# Patient Record
Sex: Male | Born: 1975 | Race: White | Hispanic: No | Marital: Married | Smoking: Former smoker
Health system: Southern US, Community
[De-identification: ages and names within clinical notes are randomized; demographics above are authoritative.]

## PROBLEM LIST (undated history)

## (undated) DIAGNOSIS — K219 Gastro-esophageal reflux disease without esophagitis: Secondary | ICD-10-CM

## (undated) DIAGNOSIS — K200 Eosinophilic esophagitis: Secondary | ICD-10-CM

## (undated) DIAGNOSIS — I2699 Other pulmonary embolism without acute cor pulmonale: Secondary | ICD-10-CM

## (undated) DIAGNOSIS — C649 Malignant neoplasm of unspecified kidney, except renal pelvis: Secondary | ICD-10-CM

## (undated) DIAGNOSIS — M1990 Unspecified osteoarthritis, unspecified site: Secondary | ICD-10-CM

## (undated) HISTORY — PX: HAND SURGERY: SHX662

## (undated) HISTORY — PX: SHOULDER SURGERY: SHX246

## (undated) HISTORY — DX: Gastro-esophageal reflux disease without esophagitis: K21.9

## (undated) HISTORY — DX: Eosinophilic esophagitis: K20.0

## (undated) HISTORY — PX: CHOLECYSTECTOMY: SHX55

## (undated) HISTORY — DX: Malignant neoplasm of unspecified kidney, except renal pelvis: C64.9

## (undated) HISTORY — PX: PARTIAL NEPHRECTOMY: SHX414

---
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
## 2003-03-17 ENCOUNTER — Emergency Department (HOSPITAL_COMMUNITY): Admission: EM | Admit: 2003-03-17 | Discharge: 2003-03-17 | Payer: Self-pay | Admitting: Internal Medicine

## 2007-12-03 ENCOUNTER — Emergency Department (HOSPITAL_COMMUNITY): Admission: EM | Admit: 2007-12-03 | Discharge: 2007-12-03 | Payer: Self-pay | Admitting: Emergency Medicine

## 2007-12-05 ENCOUNTER — Ambulatory Visit: Payer: Self-pay | Admitting: Urgent Care

## 2007-12-08 ENCOUNTER — Encounter (HOSPITAL_COMMUNITY): Admission: RE | Admit: 2007-12-08 | Discharge: 2008-01-07 | Payer: Self-pay | Admitting: Internal Medicine

## 2007-12-15 ENCOUNTER — Ambulatory Visit (HOSPITAL_COMMUNITY): Admission: RE | Admit: 2007-12-15 | Discharge: 2007-12-15 | Payer: Self-pay | Admitting: General Surgery

## 2007-12-15 ENCOUNTER — Encounter (INDEPENDENT_AMBULATORY_CARE_PROVIDER_SITE_OTHER): Payer: Self-pay | Admitting: General Surgery

## 2008-01-30 ENCOUNTER — Ambulatory Visit: Payer: Self-pay | Admitting: Internal Medicine

## 2010-09-05 NOTE — Assessment & Plan Note (Signed)
NAMEMarland [name]  VLADIMIR, LENHOFF              CHART#:  [id number]   DATE:  01/30/2008                       DOB:  November 27, 1975   PRIMARY CARE PHYSICIAN:  Mila Homer. Sudie Bailey, MD   CHIEF COMPLAINT:  Abdominal bloating and mid upper abdominal pain.   PROBLEM LIST:  1. Biliary dyskinesia and chronic cholecystitis, underwent      laparoscopic cholecystectomy by Dr. Franky Macho on December 11, 2007.  2. Abdominal bloating and increased belching, status post      cholecystectomy.  3. CT of the abdomen and pelvis with contrast on December 03, 2007,      spondylosis, otherwise normal, nothing to explain abdominal pain.   SUBJECTIVE:  The patient is a 35 year old obese Caucasian male, who is  status post laparoscopic cholecystectomy for chronic  cholecystitis/biliary dyskinesia by Dr. Lovell Sheehan on December 15, 2007.  He  did feel better after the surgery.  However, a couple of days postop,  when he began to the eat again, he began to have abdominal bloating.  His right lower quadrant pain and acute episodes of nausea and vomiting  have subsided at this point.  His main concern is an upper to mid  abdominal fullness and bloating along with some nausea and diaphoresis  at times.  The pain is anywhere from 5-8/10 on pain scale.  It is  usually after he eats, he is down to eating one meal a day.  He does  have a 16-pound documented weight loss since.  Initially seen just under  2 months ago.  He is only eating one meal a day, usually around 1:30  p.m.  He tells me within an hour to an hour and half after eating, he  has significant abdominal bloating.  He has had some bouts of diarrhea  usually only twice per week.  He denies any rectal bleeding or melena.  When he has bouts, he may have two episodes of diarrhea.  He denies any  heartburn or indigestion.  He was previously on Protonix, but only to  took it for about 10 days.  He has noticed increased belching.  Denies  any problems with increased  flatus.   CURRENT MEDICATIONS:  Tylenol p.r.n.   ALLERGIES:  Penicillin and amoxicillin.   OBJECTIVE:  VITAL SIGNS:  Weight 236 pounds, height 73 inches,  temperature 98 degrees, blood pressure 118/78, pulse 76.  GENERAL:  The patient is an obese Caucasian male, who is alert,  oriented, pleasant, and cooperative, in no acute distress.  HEENT:  Sclerae clear, nonicteric.  Conjunctivae pink.  Oropharynx pink  and moist without any lesions.  CHEST:  Heart, regular rate and rhythm.  Normal S1 and S2 without  murmurs, clicks, rubs, or gallops.  ABDOMEN:  Protuberant with positive bowel sounds x4.  No bruits  auscultated.  Soft, nontender, nondistended without palpable mass or  hepatosplenomegaly.  No rebound, tenderness, or guarding.  EXTREMITIES:  Without clubbing or edema.   ASSESSMENT:  The patient is a 35 year old Caucasian male, who is status  post laparoscopic cholecystectomy for chronic cholecystitis/biliary  dyskinesia.  He has had a 16-pound weight loss since surgery.  He  continues to have postprandial abdominal bloating in the mid to upper  abdominal pain.  He has had a good amount of belching as well.  I  suspect his dyspepsia could be related to gastroesophageal reflux  disease and I would like for him to have a good trial of proton pump  inhibitor for at least 2 weeks.  I suspect he could also have some  irritable bowel syndrome as well, and we should rule out celiac disease.  Small bowel bacterial overgrowth remains in the differential as well.   PLAN:  1. He is to begin probiotics.  I have given him the names of [facility].  2. Begin omeprazole 20 mg daily for 2 weeks trial.  I have given a      prescription for #31 with 2 refills.  3. He is going to call me in 2 weeks with a progress report.  4. Levsin 0.125 mg a.c. and at bedtime p.r.n. abdominal pain and      diarrhea, #60 with one refill.  5. CBC, LFTs, celiac antibody panel, and  IgA.  6. If he is no better in 2 weeks, we will consider EGD with possible      small bowel biopsies as well as colonoscopy if he continues to have      diarrhea.       Lorenza Burton, N.P.  Electronically Signed     R. Roetta Sessions, M.D.  Electronically Signed    KJ/MEDQ  D:  01/30/2008  T:  01/31/2008  Job:  [id number]   cc:   Mila Homer. Sudie Bailey, M.D.

## 2010-09-05 NOTE — Op Note (Signed)
NAMEZENO, HICKEL             ACCOUNT NO.:  [id number]   MEDICAL RECORD NO.:  [id number]          PATIENT TYPE:  AMB   LOCATION:  DAY                           FACILITY:  [facility]   PHYSICIAN:  Dalia Heading, M.D.  DATE OF BIRTH:  Dec 10, 1975   DATE OF PROCEDURE:  12/15/2007  DATE OF DISCHARGE:                               OPERATIVE REPORT   PREOPERATIVE DIAGNOSIS:  Chronic cholecystitis.   POSTOPERATIVE DIAGNOSIS:  Chronic cholecystitis.   PROCEDURE:  Laparoscopic cholecystectomy.   SURGEON:  Dalia Heading, MD   ANESTHESIA:  General endotracheal.   INDICATIONS:  The patient is a 35 year old white male who presents with  biliary colic secondary to chronic cholecystitis.  The risks and  benefits of the procedure including bleeding, infection, hepatobiliary  injury, and the possibility of an open procedure were fully explained to  the patient and gave informed consent.   PROCEDURE NOTE:  The patient was placed in a supine position.  After  induction of general endotracheal anesthesia, the abdomen was prepped  and draped using the usual sterile technique with Betadine.  Surgical  site confirmation was performed.   An infraumbilical incision was made down to the fascia.  A Veress needle  was introduced into the abdominal cavity and confirmation of placement  was done using the saline drop test.  The abdomen was then insufflated  to 16 mmHg pressure.  An 11-mm trocar was introduced into the abdominal  cavity under direct visualization without difficulty.  The patient was  placed in reversed Trendelenburg position.  An additional 11-mm trocar  was placed in the epigastric region and 5-mm trocars were placed in the  right upper quadrant and right flank regions.  The liver was inspected  and noted to be within normal limits.  The gallbladder was retracted  superior and laterally.  The dissection was begun around the  infundibulum of the gallbladder.  The cystic duct was first  identified.  The juncture to the infundibulum was fully identified.  Endo clips were  placed proximally and distally on the cystic duct and cystic duct was  divided.  This likewise done on the cystic artery.  The gallbladder was  then freed away from the gallbladder fossa using Bovie electrocautery.  The gallbladder was delivered through the epigastric trocar site using  EndoCatch bag.  The gallbladder fossa was inspected and no abnormal  bleeding or bile leakage was noted.  Surgicel was placed in the  gallbladder fossa.  All fluid and air were then evacuated from the  abdominal cavity prior to removal of the trocars.   All wounds were irrigated with normal saline.  All wounds were injected  with 0.5% Sensorcaine.  The infraumbilical fascia was reapproximated  using an 0 Vicryl interrupted suture.  All skin incisions were closed  using staples.  Betadine ointment and dry sterile dressings were  applied.   All tape and needle counts were correct at the end of the procedure.  The patient was extubated in the operating room and went back to the  recovery room awake in stable condition.   COMPLICATIONS:  None.   SPECIMEN:  Gallbladder.   ESTIMATED BLOOD LOSS:  Minimal.      Dalia Heading, M.D.  Electronically Signed     MAJ/MEDQ  D:  12/15/2007  T:  12/15/2007  Job:  [id number]   cc:   R. Roetta Sessions, M.D.  [address]  [place] [address]

## 2010-09-05 NOTE — H&P (Signed)
NAMEPORFIRIO, Bobby [name]             ACCOUNT NO.:  [id number]   MEDICAL RECORD NO.:  [id number]          PATIENT TYPE:  AMB   LOCATION:  DAY                           FACILITY:  [facility]   PHYSICIAN:  Dalia Heading, M.D.  DATE OF BIRTH:  [birth year]   DATE OF ADMISSION:  DATE OF DISCHARGE:  LH                              HISTORY & PHYSICAL   CHIEF COMPLAINT:  Chronic cholecystitis.   HISTORY OF PRESENT ILLNESS:  The patient is a 35 year old white male who  is referred for evaluation and treatment of biliary colic secondary to  chronic cholecystitis.  He has been having intermittent right upper  quadrant abdominal pain with radiation to the flank, nausea, and  indigestion in the past few months.  It is made worse with fatty foods.  No fever, chills, or jaundice had been noted.   PAST MEDICAL HISTORY:  Unremarkable.   PAST SURGICAL HISTORY:  Unremarkable.   CURRENT MEDICATIONS:  Oxycodone and Protonix.   ALLERGIES:  PENICILLIN.   REVIEW OF SYSTEMS:  He smokes less than a pack of cigarettes a day.  He  denies any alcohol use.  He denies any other cardiopulmonary  difficulties or bleeding disorders.   PHYSICAL EXAMINATION:  GENERAL:  The patient is a well-developed, well-  nourished white male in no acute distress.  HEENT:  No scleral icterus.  LUNGS:  Clear to auscultation with equal breath sounds bilaterally.  HEART:  Regular rate and rhythm without S3, S4, or murmurs.  ABDOMEN:  Soft, nontender, and nondistended.  No hepatosplenomegaly,  masses, or hernias identified.  Ultrasound of the gallbladder was  negative.   The HIDA scan reveals chronic cholecystitis with a low gallbladder  ejection fraction and reproducible symptoms with a fatty meal.   IMPRESSION:  Chronic cholecystitis.   PLAN:  The patient was scheduled for laparoscopic cholecystectomy on  December 15, 2007.  The risks and benefits of the procedure including  bleeding, infection, hepatobiliary injury, and the  possibility of an  open procedure were fully explained to the patient, who gave informed  consent.  Percocet has been prescribed.      Dalia Heading, M.D.  Electronically Signed     MAJ/MEDQ  D:  12/11/2007  T:  12/12/2007  Job:  [id number]   cc:   Mila Homer. Sudie Bailey, M.D.  Fax: [phone]   R. Roetta Sessions, M.D.  [address]  [place]  [place] [address]

## 2010-09-05 NOTE — Assessment & Plan Note (Signed)
NAMEMarland Kitchen  EMIN, FOREE              CHART#:  [id number]   DATE:  12/05/2007                       DOB:  March 27, 1976   REQUESTING PHYSICIAN:  Mila Homer. Sudie Bailey, MD.   CHIEF COMPLAINT:  Abdominal pain.   HISTORY OF PRESENT ILLNESS:  The patient is a 35 year old Caucasian male  who is otherwise healthy.  About 2 months ago, he began to notice a  severe postprandial abdominal pain.  He says within 20 minutes of  eating, he describes of significant gas-type pain.  It is present mostly  in his right lower quadrant.  He tells me the pain is only present after  he eats.  If he does not eat, he is not bothered by the pain.  He had a  full set of stool studies, which is negative to Dr. Michelle Nasuti office  per his report.  He tells me he is scared to eat.  He has had some  intermittent diarrhea, but no more than 4 loose stools per day.  He can  go a day or so without a bowel movement.  His stools are occasionally  hard.  He complains of diaphoresis with the episodes.  Pain does radiate  to his right lower quadrant, right flank, and right back.  He rates pain  10/10 at worst on the pain scale.  His weight has remained stable.  He  does have rare heartburn or indigestion, but usually only with certain  foods and sodas.  His symptoms are worse at night.  He has been on  Protonix for a couple of days and does not notice a difference.  He does  have oxycodone for pain at home.  He denies any dysphagia, odynophagia,  early satiety, rectal bleeding, or melena.  He was Hemoccult negative in  the emergency room.  Thus far, his workup has included a CT scan of  abdomen and pelvis with contrast, which shows spondylosis.  He had a  bedside gallbladder ultrasound in the ER, which was negative as well.   PAST MEDICAL HISTORY:  Denies.   PAST SURGICAL HISTORY:  Denies.   CURRENT MEDICATIONS:  Pantoprazole 40 mg daily, oxycodone 7.5/500  p.r.n., Tums p.r.n., and Tylenol p.r.n.   ALLERGIES:  Penicillin and  amoxicillin.   FAMILY HISTORY:  There is no family history of colorectal carcinoma,  liver, or chronic GI problem.  Father does have a history of prostate  disorder and mother has migraine headaches.  He has 3 healthy siblings.   SOCIAL HISTORY:  The patient lives with his brother.  He is single.  He  is an Airline pilot for [employer].  He has a remote history of  tobacco use.  Denies alcohol or drug use.   REVIEW OF SYSTEMS:  See HPI.  GU:  He denies any dysuria, hematuria, and  increased urinary frequency or otherwise negative review of systems.   PHYSICAL EXAMINATION:  VITAL SIGNS:  Weight 252 pounds, height 73  inches, temperature 98.6, blood pressure 112/82, and pulse 68.  GENERAL:  He is a well-developed and well-nourished Caucasian male, in  no acute distress.  HEENT:  Sclerae clear and nonicteric.  Conjunctivae pink.  Oropharynx  pink and moist without any lesions.  NECK:  Supple without mass or thyromegaly.  CHEST:  Regular rate and rhythm.  Normal S1 and S2.  No  murmurs, clicks,  rubs, or gallops.  LUNGS:  Clear to auscultation bilaterally.  ABDOMEN:  Positive bowel sounds x4.  No bruits auscultated.  Soft,  nontender, and nondistended without palpable mass or hepatosplenomegaly.  No rashes or guarding.  Negative Murphy's sign.  No abdominal wall  pain/negative Carnett sign.  No rebound, tenderness, or guarding.  EXTREMITIES:  Without clubbing or edema bilaterally.   IMPRESSION:  The patient is a 35 year old Caucasian male with a 69-month  history of severe postprandial pain.  Interestingly, the pain is mostly  in his right lower quadrant, does occasionally radiate to his back.  His  symptoms are typical for biliary dyskinesia except for the fact of the  location of his pain, which is much lower than with typical biliary  pain.  He has been examined by Dr. Jena Gauss as well.  He was found to have  spondylosis on CT referred or radicular back pain would be less likely   given the fact that his pain seems to be more visceral.   PLAN:  1. HIDA scan.  2. He has oxycodone at home for pain.  3. He is to continue on pantoprazole 40 mg daily.  4. We will call him with HIDA results and go from there.  5. He is to go immediately to emergency room if his pain is worse or      not controlled by his medications or has any worsening signs or      symptoms and he agrees with this plan.   Thank you Dr. Sudie Bailey for allowing [place] to participate in care of the  patient.       Lorenza Burton, N.P.  Electronically Signed     R. Roetta Sessions, M.D.  Electronically Signed    KJ/MEDQ  D:  12/05/2007  T:  12/05/2007  Job:  [id number]   cc:   Mila Homer. Sudie Bailey, M.D.

## 2011-01-19 LAB — COMPREHENSIVE METABOLIC PANEL
ALT: 38
AST: 29
Albumin: 3.7
Alkaline Phosphatase: 59
BUN: 18
CO2: 30
Calcium: 8.9
Chloride: 103
Creatinine, Ser: 1.42
GFR calc Af Amer: >60
GFR calc non Af Amer: 59 — ABNORMAL LOW
Glucose, Bld: 91
Potassium: 3.7
Sodium: 138
Total Bilirubin: 0.8
Total Protein: 5.8 — ABNORMAL LOW

## 2011-01-19 LAB — DIFFERENTIAL
Basophils Absolute: 0.1
Basophils Relative: 1
Eosinophils Absolute: 0.4
Eosinophils Relative: 5
Lymphocytes Relative: 17
Lymphs Abs: 1.5
Monocytes Absolute: 0.3
Monocytes Relative: 4
Neutro Abs: 6.3
Neutrophils Relative %: 72

## 2011-01-19 LAB — URINALYSIS, ROUTINE W REFLEX MICROSCOPIC
Bilirubin Urine: NEGATIVE
Glucose, UA: NEGATIVE
Hgb urine dipstick: NEGATIVE
Ketones, ur: 15 — AB
Nitrite: NEGATIVE
Protein, ur: NEGATIVE
Specific Gravity, Urine: 1.035 — ABNORMAL HIGH
Urobilinogen, UA: 1
pH: 6.5

## 2011-01-19 LAB — CBC
HCT: 42
Hemoglobin: 14.6
MCHC: 34.7
MCV: 91.7
Platelets: 187
RBC: 4.57
RDW: 12.2
WBC: 8.6

## 2011-01-19 LAB — LIPASE, BLOOD: Lipase: 23

## 2011-01-19 LAB — OCCULT BLOOD X 1 CARD TO LAB, STOOL: Fecal Occult Bld: NEGATIVE

## 2012-11-12 ENCOUNTER — Encounter: Payer: Self-pay | Admitting: Gastroenterology

## 2012-11-12 ENCOUNTER — Ambulatory Visit (INDEPENDENT_AMBULATORY_CARE_PROVIDER_SITE_OTHER): Payer: BC Managed Care – PPO | Admitting: Gastroenterology

## 2012-11-12 VITALS — BP 130/80 | HR 95 | Temp 97.4°F | Ht 73.0 in | Wt 250.4 lb

## 2012-11-12 DIAGNOSIS — R1310 Dysphagia, unspecified: Secondary | ICD-10-CM

## 2012-11-12 DIAGNOSIS — K219 Gastro-esophageal reflux disease without esophagitis: Secondary | ICD-10-CM

## 2012-11-12 MED ORDER — ESOMEPRAZOLE MAGNESIUM 40 MG PO CPDR: 40.0000 mg | DELAYED_RELEASE_CAPSULE | Freq: Two times a day (BID) | ORAL | Status: DC

## 2012-11-12 NOTE — Progress Notes (Signed)
CC PCP 

## 2012-11-12 NOTE — Patient Instructions (Addendum)
Start taking Nexium twice a day, 30 minutes before breakfast and dinner. I have sent this to the pharmacy as well and provided samples to get your started.  Follow the soft diet for now. Chew very well. Avoid any tough textures.  We have set you up for an upper endoscopy with dilation with Dr. Jena Gauss in the near future. Further recommendations to follow.

## 2012-11-12 NOTE — Progress Notes (Signed)
Primary Care Physician:  Milana Obey, MD Primary Gastroenterologist:  Dr. Jena Gauss   Chief Complaint  Patient presents with  . Dysphagia    HPI:   Bobby [name] is a pleasant 37 year old male presenting today as a self-referral secondary to dysphagia and dyspepsia. He was last seen by [place] several years ago but did not undergo any procedures.   Worsening dysphagia, daily heartburn. Feels like a ball of fire epigastric region. Symptoms a little over a year. Now regurgitating food. Intense epigastric pain at times. Breads, red meats cause issues. Sometimes can't swallow soda, water, tea. "keeps piling". No odynophagia. If food gets lodged, has to throw it up. No pill dysphagia. Decreased po intake due to fear of eating. No weight loss. No melena. Rare NSAIDs for headache, usually three times a month.   Has taken Prilosec in past. Now just Tums.   Past Medical History  Diagnosis Date  . GERD (gastroesophageal reflux disease)     Past Surgical History  Procedure Laterality Date  . Cholecystectomy    . Hand surgery      right    Current Outpatient Prescriptions  Medication Sig Dispense Refill  . calcium carbonate (TUMS EX) 750 MG chewable tablet Chew 1 tablet by mouth daily.      Marland Kitchen esomeprazole (NEXIUM) 40 MG capsule Take 1 capsule (40 mg total) by mouth 2 (two) times daily.  60 capsule  3   No current facility-administered medications for this visit.    Allergies as of 11/12/2012 - Review Complete 11/12/2012  Allergen Reaction Noted  . Penicillins Hives 11/12/2012    Family History  Problem Relation Age of Onset  . Colon cancer Neg Hx     History   Social History  . Marital Status: Single    Spouse Name: N/A    Number of Children: N/A  . Years of Education: N/A   Occupational History  . [employer]    Social History Main Topics  . Smoking status: Never Smoker   . Smokeless tobacco: Current User    Types: Chew  . Alcohol Use: Yes     Comment: rare  . Drug Use: No   . Sexually Active: Not on file   Other Topics Concern  . Not on file   Social History Narrative  . No narrative on file    Review of Systems: Negative unless mentioned in HPI.   Physical Exam: BP 130/80  Pulse 95  Temp(Src) 97.4 F (36.3 C) (Oral)  Ht 6\' 1"  (1.854 m)  Wt 250 lb 6.4 oz (113.581 kg)  BMI 33.04 kg/m2 General:   Alert and oriented. Pleasant and cooperative. Well-nourished and well-developed.  Head:  Normocephalic and atraumatic. Eyes:  Without icterus, sclera clear and conjunctiva pink.  Ears:  Normal auditory acuity. Nose:  No deformity, discharge,  or lesions. Mouth:  No deformity or lesions, oral mucosa pink.  Neck:  Supple, without mass or thyromegaly. Lungs:  Clear to auscultation bilaterally. No wheezes, rales, or rhonchi. No distress.  Heart:  S1, S2 present without murmurs appreciated.  Abdomen:  +BS, soft, non-tender and non-distended. No HSM noted. No guarding or rebound. No masses appreciated.  Rectal:  Deferred  Msk:  Symmetrical without gross deformities. Normal posture. Extremities:  Without clubbing or edema. Neurologic:  Alert and  oriented x4;  grossly normal neurologically. Skin:  Intact without significant lesions or rashes. Cervical Nodes:  No significant cervical adenopathy. Psych:  Alert and cooperative. Normal mood and affect.

## 2012-11-12 NOTE — Assessment & Plan Note (Signed)
Uncontrolled and symptomatic. Start Nexium BID.

## 2012-11-12 NOTE — Assessment & Plan Note (Signed)
37 year old male presenting with history of chronic GERD, now with worsening solid food dysphagia, regurgitation of food, and dyspepsia for at least a year. Epigastric discomfort noted after eating at times; gallbladder absent. He has severe GERD, taking multiple Tums per day. Very minimal NSAID use. No melena. At this time, query uncontrolled GERD as culprit, with likely stricture, ring, or web. Needs EGD/ED as soon as possible; I have discussed avoidance of tough textures and only following a soft diet until after procedure.   Proceed with upper endoscopy and dilation in the near future with Dr. Jena Gauss. The risks, benefits, and alternatives have been discussed in detail with patient. They have stated understanding and desire to proceed.  Nexium BID: samples provided. GERD diet and soft diet provided

## 2012-11-13 ENCOUNTER — Ambulatory Visit (HOSPITAL_COMMUNITY)
Admission: RE | Admit: 2012-11-13 | Discharge: 2012-11-13 | Disposition: A | Discharge status: Home / Self Care | Payer: BC Managed Care – PPO | Source: Ambulatory Visit | Attending: Internal Medicine | Admitting: Internal Medicine

## 2012-11-13 ENCOUNTER — Encounter (HOSPITAL_COMMUNITY): Payer: Self-pay | Admitting: *Deleted

## 2012-11-13 ENCOUNTER — Encounter (HOSPITAL_COMMUNITY)
Admission: RE | Disposition: A | Discharge status: Home / Self Care | Payer: Self-pay | Source: Ambulatory Visit | Attending: Internal Medicine

## 2012-11-13 DIAGNOSIS — K2940 Chronic atrophic gastritis without bleeding: Secondary | ICD-10-CM | POA: Insufficient documentation

## 2012-11-13 DIAGNOSIS — K2100 Gastro-esophageal reflux disease with esophagitis, without bleeding: Secondary | ICD-10-CM | POA: Insufficient documentation

## 2012-11-13 DIAGNOSIS — K219 Gastro-esophageal reflux disease without esophagitis: Secondary | ICD-10-CM

## 2012-11-13 DIAGNOSIS — R1310 Dysphagia, unspecified: Secondary | ICD-10-CM

## 2012-11-13 DIAGNOSIS — K2960 Other gastritis without bleeding: Secondary | ICD-10-CM

## 2012-11-13 HISTORY — PX: ESOPHAGOGASTRODUODENOSCOPY (EGD) WITH ESOPHAGEAL DILATION: SHX5812

## 2012-11-13 SURGERY — ESOPHAGOGASTRODUODENOSCOPY (EGD) WITH ESOPHAGEAL DILATION
Anesthesia: Moderate Sedation

## 2012-11-13 MED ORDER — MEPERIDINE HCL 100 MG/ML IJ SOLN
INTRAMUSCULAR | Status: AC
  Filled 2012-11-13: qty 1

## 2012-11-13 MED ORDER — MIDAZOLAM HCL 5 MG/5ML IJ SOLN
INTRAMUSCULAR | Status: DC | PRN
  Administered 2012-11-13: 1 mg via INTRAVENOUS
  Administered 2012-11-13 (×2): 2 mg via INTRAVENOUS

## 2012-11-13 MED ORDER — SODIUM CHLORIDE 0.9 % IV SOLN
INTRAVENOUS | Status: DC
  Administered 2012-11-13: 15:00:00 via INTRAVENOUS

## 2012-11-13 MED ORDER — ONDANSETRON HCL 4 MG/2ML IJ SOLN
INTRAMUSCULAR | Status: AC
  Filled 2012-11-13: qty 2

## 2012-11-13 MED ORDER — BUTAMBEN-TETRACAINE-BENZOCAINE 2-2-14 % EX AERO
INHALATION_SPRAY | CUTANEOUS | Status: DC | PRN
  Administered 2012-11-13: 2 via TOPICAL

## 2012-11-13 MED ORDER — STERILE WATER FOR IRRIGATION IR SOLN
Status: DC | PRN
  Administered 2012-11-13: 16:00:00

## 2012-11-13 MED ORDER — MIDAZOLAM HCL 5 MG/5ML IJ SOLN
INTRAMUSCULAR | Status: AC
  Filled 2012-11-13: qty 10

## 2012-11-13 MED ORDER — ONDANSETRON HCL 4 MG/2ML IJ SOLN
INTRAMUSCULAR | Status: DC | PRN
  Administered 2012-11-13: 4 mg via INTRAVENOUS

## 2012-11-13 MED ORDER — MEPERIDINE HCL 100 MG/ML IJ SOLN
INTRAMUSCULAR | Status: DC | PRN
  Administered 2012-11-13: 25 mg via INTRAVENOUS
  Administered 2012-11-13 (×2): 50 mg via INTRAVENOUS

## 2012-11-13 NOTE — Interval H&P Note (Signed)
History and Physical Interval Note:  11/13/2012 4:19 PM  Aris Lot  has presented today for surgery, with the diagnosis of GERD AND DYSPHAGIA  The various methods of treatment have been discussed with the patient and family. After consideration of risks, benefits and other options for treatment, the patient has consented to  Procedure(s) with comments: ESOPHAGOGASTRODUODENOSCOPY (EGD) WITH ESOPHAGEAL DILATION (N/A) - 3:00 as a surgical intervention .  The patient's history has been reviewed, patient examined, no change in status, stable for surgery.  I have reviewed the patient's chart and labs.  Questions were answered to the patient's satisfaction.     Kisa Fujii  No change. Hasn't been able start Nexium as of yet.The risks, benefits, limitations, alternatives and imponderables have been reviewed with the patient. Potential for esophageal dilation, biopsy, etc. have also been reviewed.  Questions have been answered. All parties agreeable.

## 2012-11-13 NOTE — Op Note (Signed)
[facility] [address] [place], [address]   ENDOSCOPY PROCEDURE REPORT  PATIENT: Bobby, [name]  MR#: [id number] BIRTHDATE: 18-Nov-1975 , 36  yrs. old GENDER: Male ENDOSCOPIST:  R.  Roetta Sessions, MD FACP FACG REFERRED BY:  Gareth Morgan, M.D. PROCEDURE DATE:  11/13/2012 PROCEDURE:     EGD with Elease Hashimoto dilation followed by esophageal and gastric biopsy  INDICATIONS:     Daily GERD and esophageal dysphagia  INFORMED CONSENT:   The risks, benefits, limitations, alternatives and imponderables have been discussed.  The potential for biopsy, esophogeal dilation, etc. have also been reviewed.  Questions have been answered.  All parties agreeable.  Please see the history and physical in the medical record for more information.  MEDICATIONS:    Versed 5 mg IV and Demerol 125 mg IV in divided doses. Zofran 4 mg IV. Cetacaine spray  DESCRIPTION OF PROCEDURE:   The EG-[id number] ([id number])  endoscope was introduced through the mouth and advanced to the second portion of the duodenum without difficulty or limitations.  The mucosal surfaces were surveyed very carefully during advancement of the scope and upon withdrawal.  Retroflexion view of the proximal stomach and esophagogastric junction was performed.      FINDINGS: Longitudinally somewhat furrowed  esophageal mucosa. Distal esophageal erosions within 5 mm the GE junction. No Barrett's esophagus seen. Patient's esophagus appeared widely patent and appropriate diameter given his relatively large frame. However, there was slight resistance upon advancing the diagnostic gastroscope through it. Stomach empty. Diffuse gastric erosions. No ulcer or infiltrating process observed. Patent pylorus. Duodenal erosions present; otherwise, the first  and second portion of the duodenum appeared normal.  THERAPEUTIC / DIAGNOSTIC MANEUVERS PERFORMED:  A 54 French Maloney dilator was  passed to full insertion with mild  resistance. A look back revealed superficial trauma involving the distal 5 cm of esophageal mucosa. There were no significant tears present. Minimal bleeding. Subsequently, biopsies of the gastric mucosa were taken and, finally, biopsies the mid and distal esophagus taken to evaluate for eosinophilic esophagitis.   COMPLICATIONS:  None  IMPRESSION:   Mild erosive reflux esophagitis -status post passage of a Maloney dilator. Status post esophageal biopsy Gastric and duodenal bulbar erosions-status post biopsy  RECOMMENDATIONS:  Begin Nexium 40 mg orally twice daily as previously recommended. Followup on pathology. Further recommendations to follow.    _______________________________ R. Roetta Sessions, MD FACP [facility] eSigned:  R. Roetta Sessions, MD FACP [facility] 11/13/2012 4:57 PM     CC:  PATIENT NAME:  Bobby, [name] MR#: [id number]

## 2012-11-13 NOTE — H&P (View-Only) (Signed)
Primary Care Physician:  KNOWLTON,STEPHEN D, MD Primary Gastroenterologist:  Dr. Rourk   Chief Complaint  Patient presents with  . Dysphagia    HPI:   [name] is a pleasant 36-year-old male presenting today as a self-referral secondary to dysphagia and dyspepsia. He was last seen by us several years ago but did not undergo any procedures.   Worsening dysphagia, daily heartburn. Feels like a ball of fire epigastric region. Symptoms a little over a year. Now regurgitating food. Intense epigastric pain at times. Breads, red meats cause issues. Sometimes can't swallow soda, water, tea. "keeps piling". No odynophagia. If food gets lodged, has to throw it up. No pill dysphagia. Decreased po intake due to fear of eating. No weight loss. No melena. Rare NSAIDs for headache, usually three times a month.   Has taken Prilosec in past. Now just Tums.   Past Medical History  Diagnosis Date  . GERD (gastroesophageal reflux disease)     Past Surgical History  Procedure Laterality Date  . Cholecystectomy    . Hand surgery      right    Current Outpatient Prescriptions  Medication Sig Dispense Refill  . calcium carbonate (TUMS EX) 750 MG chewable tablet Chew 1 tablet by mouth daily.      . esomeprazole (NEXIUM) 40 MG capsule Take 1 capsule (40 mg total) by mouth 2 (two) times daily.  60 capsule  3   No current facility-administered medications for this visit.    Allergies as of 11/12/2012 - Review Complete 11/12/2012  Allergen Reaction Noted  . Penicillins Hives 11/12/2012    Family History  Problem Relation Age of Onset  . Colon cancer Neg Hx     History   Social History  . Marital Status: Single    Spouse Name: N/A    Number of Children: N/A  . Years of Education: N/A   Occupational History  . [employer]    Social History Main Topics  . Smoking status: Never Smoker   . Smokeless tobacco: Current User    Types: Chew  . Alcohol Use: Yes     Comment: rare  . Drug Use: No   . Sexually Active: Not on file   Other Topics Concern  . Not on file   Social History Narrative  . No narrative on file    Review of Systems: Negative unless mentioned in HPI.   Physical Exam: BP 130/80  Pulse 95  Temp(Src) 97.4 F (36.3 C) (Oral)  Ht 6' 1" (1.854 m)  Wt 250 lb 6.4 oz (113.581 kg)  BMI 33.04 kg/m2 General:   Alert and oriented. Pleasant and cooperative. Well-nourished and well-developed.  Head:  Normocephalic and atraumatic. Eyes:  Without icterus, sclera clear and conjunctiva pink.  Ears:  Normal auditory acuity. Nose:  No deformity, discharge,  or lesions. Mouth:  No deformity or lesions, oral mucosa pink.  Neck:  Supple, without mass or thyromegaly. Lungs:  Clear to auscultation bilaterally. No wheezes, rales, or rhonchi. No distress.  Heart:  S1, S2 present without murmurs appreciated.  Abdomen:  +BS, soft, non-tender and non-distended. No HSM noted. No guarding or rebound. No masses appreciated.  Rectal:  Deferred  Msk:  Symmetrical without gross deformities. Normal posture. Extremities:  Without clubbing or edema. Neurologic:  Alert and  oriented x4;  grossly normal neurologically. Skin:  Intact without significant lesions or rashes. Cervical Nodes:  No significant cervical adenopathy. Psych:  Alert and cooperative. Normal mood and affect.    

## 2012-11-18 ENCOUNTER — Encounter: Payer: Self-pay | Admitting: Internal Medicine

## 2012-11-18 ENCOUNTER — Encounter (HOSPITAL_COMMUNITY): Payer: Self-pay | Admitting: Internal Medicine

## 2012-12-23 ENCOUNTER — Encounter: Payer: Self-pay | Admitting: Internal Medicine

## 2012-12-24 ENCOUNTER — Telehealth: Payer: Self-pay | Admitting: Internal Medicine

## 2012-12-24 ENCOUNTER — Ambulatory Visit: Payer: BC Managed Care – PPO | Admitting: Gastroenterology

## 2012-12-24 NOTE — Telephone Encounter (Signed)
Please send letter for f/u. First no-show.

## 2012-12-24 NOTE — Telephone Encounter (Signed)
Pt was a no show

## 2012-12-25 ENCOUNTER — Encounter: Payer: Self-pay | Admitting: Gastroenterology

## 2012-12-25 NOTE — Telephone Encounter (Signed)
Mailed letter °

## 2013-08-03 ENCOUNTER — Encounter (HOSPITAL_COMMUNITY): Payer: Self-pay | Admitting: Pharmacy Technician

## 2013-08-12 NOTE — Patient Instructions (Signed)
Bobby [name]  08/12/2013   Your procedure is scheduled on:   08/17/2013  [name]  [facility].  Call this number if you have problems the morning of surgery: [phone]   Remember:   Do not eat food or drink liquids after midnight.   Take these medicines the morning of surgery with A SIP OF WATER:  nexium   Do not wear jewelry, make-up or nail polish.  Do not wear lotions, powders, or perfumes.   Do not shave 48 hours prior to surgery. Men may shave face and neck.  Do not bring valuables to the hospital.  [facility] or [facility]uables.               Contacts, dentures or bridgework may not be worn into surgery.  Leave suitcase in the car. After surgery it may be brought to your room.  For patients admitted to the hospital, discharge time is determined by your treatment team.               Patients discharged the day of surgery will not be allowed to drive home.  Name and phone number of your driver: family  Special Instructions: Shower using CHG 2 nights before surgery and the night before surgery.  If you shower the day of surgery use CHG.  Use special wash - you have one bottle of CHG for all showers.  You should use approximately 1/3 of the bottle for each shower.   Please read over the following fact sheets that you were given: Pain Booklet, Coughing and Deep Breathing, Surgical Site Infection Prevention, Anesthesia Post-op Instructions and Care and Recovery After Surgery Hernia A hernia happens when an organ inside your body pushes out through a weak spot in your belly (abdominal) wall. Most hernias get worse over time. They can often be pushed back into place (reduced). Surgery may be needed to repair hernias that cannot be pushed into place. HOME CARE  Keep doing normal activities.  Avoid lifting more than 10 pounds (4.5 kilograms).  Cough gently and avoid straining. Over time, these things will:  Increase your  hernia size.  Irritate your hernia.  Break down hernia repairs.  Stop smoking.  Do not wear anything tight over your hernia. Do not keep the hernia in with an outside bandage.  Eat food that is high in fiber (fruit, vegetables, whole grains).  Drink enough fluids to keep your pee (urine) clear or pale yellow.  Take medicines to make your poop soft (stool softeners) if you cannot poop (constipated). GET HELP RIGHT AWAY IF:   You have a fever.  You have belly pain that gets worse.  You feel sick to your stomach (nauseous) and throw up (vomit).  Your skin starts to bulge out.  Your hernia turns a different color, feels hard, or is tender.  You have increased pain or puffiness (swelling) around the hernia.  You poop more or less often.  Your poop does not look the way normally does.  You have watery poop (diarrhea).  You cannot push the hernia back in place by applying gentle pressure while lying down. MAKE SURE YOU:   Understand these instructions.  Will watch your condition.  Will get help right away if you are not doing well or get worse. Document Released: 09/27/2009 Document Revised: 07/02/2011 Document Reviewed: 09/27/2009 [facility] Patient Information 2014 [place], [place]. PATIENT INSTRUCTIONS POST-ANESTHESIA  IMMEDIATELY FOLLOWING  SURGERY:  Do not drive or operate machinery for the first twenty four hours after surgery.  Do not make any important decisions for twenty four hours after surgery or while taking narcotic pain medications or sedatives.  If you develop intractable nausea and vomiting or a severe headache please notify your doctor immediately.  FOLLOW-UP:  Please make an appointment with your surgeon as instructed. You do not need to follow up with anesthesia unless specifically instructed to do so.  WOUND CARE INSTRUCTIONS (if applicable):  Keep a dry clean dressing on the anesthesia/puncture wound site if there is drainage.  Once the wound has quit  draining you may leave it open to air.  Generally you should leave the bandage intact for twenty four hours unless there is drainage.  If the epidural site drains for more than 36-48 hours please call the anesthesia department.  QUESTIONS?:  Please feel free to call your physician or the hospital operator if you have any questions, and they will be happy to assist you.

## 2013-08-13 ENCOUNTER — Encounter (HOSPITAL_COMMUNITY): Payer: Self-pay

## 2013-08-13 ENCOUNTER — Encounter (HOSPITAL_COMMUNITY)
Admission: RE | Admit: 2013-08-13 | Discharge: 2013-08-13 | Disposition: A | Discharge status: Home / Self Care | Payer: BC Managed Care – PPO | Source: Ambulatory Visit | Attending: General Surgery | Admitting: General Surgery

## 2013-08-13 DIAGNOSIS — Z01812 Encounter for preprocedural laboratory examination: Secondary | ICD-10-CM | POA: Insufficient documentation

## 2013-08-13 HISTORY — DX: Unspecified osteoarthritis, unspecified site: M19.90

## 2013-08-13 LAB — CBC WITH DIFFERENTIAL/PLATELET
Basophils Absolute: 0 10*3/uL (ref 0.0–0.1)
Basophils Relative: 0 % (ref 0–1)
EOS ABS: 0.2 10*3/uL (ref 0.0–0.7)
EOS PCT: 3 % (ref 0–5)
HEMATOCRIT: 44.1 % (ref 39.0–52.0)
HEMOGLOBIN: 16.1 g/dL (ref 13.0–17.0)
LYMPHS ABS: 3.1 10*3/uL (ref 0.7–4.0)
LYMPHS PCT: 32 % (ref 12–46)
MCH: 31.4 pg (ref 26.0–34.0)
MCHC: 36.5 g/dL — ABNORMAL HIGH (ref 30.0–36.0)
MCV: 86 fL (ref 78.0–100.0)
MONOS PCT: 6 % (ref 3–12)
Monocytes Absolute: 0.6 10*3/uL (ref 0.1–1.0)
Neutro Abs: 5.7 10*3/uL (ref 1.7–7.7)
Neutrophils Relative %: 59 % (ref 43–77)
Platelets: 257 10*3/uL (ref 150–400)
RBC: 5.13 MIL/uL (ref 4.22–5.81)
RDW: 12.5 % (ref 11.5–15.5)
WBC: 9.6 10*3/uL (ref 4.0–10.5)

## 2013-08-13 LAB — BASIC METABOLIC PANEL
BUN: 19 mg/dL (ref 6–23)
CHLORIDE: 98 meq/L (ref 96–112)
CO2: 32 meq/L (ref 19–32)
Calcium: 10.1 mg/dL (ref 8.4–10.5)
Creatinine, Ser: 1.39 mg/dL — ABNORMAL HIGH (ref 0.50–1.35)
GFR calc Af Amer: 75 mL/min — ABNORMAL LOW (ref >90)
GFR calc non Af Amer: 64 mL/min — ABNORMAL LOW (ref >90)
GLUCOSE: 109 mg/dL — AB (ref 70–99)
POTASSIUM: 4.6 meq/L (ref 3.7–5.3)
SODIUM: 139 meq/L (ref 137–147)

## 2013-08-13 MED ORDER — CHLORHEXIDINE GLUCONATE 4 % EX LIQD: 1.0000 "application " | Freq: Once | CUTANEOUS | Status: DC

## 2013-08-13 NOTE — Pre-Procedure Instructions (Signed)
PAT completed.  Patient verbalized understanding of all instructions given.

## 2013-08-13 NOTE — H&P (Signed)
  NTS SOAP Note  Vital Signs:  Vitals as of: 07/23/2013: Systolic 150: Diastolic 82: Heart Rate 76: Temp 97.77F: Height 396ft 1in: Weight 255Lbs 0 Ounces: Pain Level 6: BMI 33.64  BMI : 33.64 kg/m2  Subjective: This [age] Years 843 Months old Male presents for of a right inguinal hernia.  Has been bothering him for several months.  Made worse with straining.  Pain radiates to right testicle.  Review of Symptoms:  Constitutional:unremarkable   Head:unremarkable    Eyes:unremarkable   Nose/Mouth/Throat:unremarkable Cardiovascular:  unremarkable   Respiratory:unremarkable   Gastrointestin    abdominal pain Genitourinary:unremarkable     Musculoskeletal:unremarkable   Skin:unremarkable Hematolgic/Lymphatic:unremarkable     Allergic/Immunologic:unremarkable     Past Medical History:    Reviewed  Past Medical History  Surgical History: cholecystectomy, right hand surgery Medical Problems: none Allergies: PCN, cipro   Social History:Reviewed  Social History  Preferred Language: English Race:  White Ethnicity: Not Hispanic / Latino Age: [age] Years 3 Months Marital Status:  S Alcohol: unknown   Smoking Status: Never smoker reviewed on 07/23/2013 Functional Status reviewed on 07/23/2013 ------------------------------------------------ Bathing: Normal Cooking: Normal Dressing: Normal Driving: Normal Eating: Normal Managing Meds: Normal Oral Care: Normal Shopping: Normal Toileting: Normal Transferring: Normal Walking: Normal Cognitive Status reviewed on 07/23/2013 ------------------------------------------------ Attention: Normal Decision Making: Normal Language: Normal Memory: Normal Motor: Normal Perception: Normal Problem Solving: Normal Visual and Spatial: Normal   Family History:  Reviewed  Family Health History Mother, Living; Healthy;  Father, Living; Healthy;     Objective Information: General:  Well  appearing, well nourished in no distress. Heart:  RRR, no murmur Lungs:    CTA bilaterally, no wheezes, rhonchi, rales.  Breathing unlabored. Abdomen:Soft, NT/ND, no HSM, no masses.  Reducible right inguinal hernia. BJ:[id number]:unremarkable    Assessment:Right inguinal hernia  Diagnoses: 550.90 Inguinal hernia (Unilateral inguinal hernia, without obstruction or gangrene, not specified as recurrent)  Procedures: [id number] - OFFICE OUTPATIENT NEW 30 MINUTES    Plan:  Patient will call to schedule right inguinal herniorrhaphy with mesh.   Patient Education:Alternative treatments to surgery were discussed with patient (and family).  Risks and benefits  of procedure including bleeding, infection, mesh use, and the possibility of recurrence of the hernia were fully explained to the patient (and family) who gave informed consent. Patient/family questions were addressed.  Follow-up:Pending Surgery

## 2013-08-17 ENCOUNTER — Encounter (HOSPITAL_COMMUNITY): Payer: Self-pay | Admitting: *Deleted

## 2013-08-17 ENCOUNTER — Encounter (HOSPITAL_COMMUNITY)
Admission: RE | Disposition: A | Discharge status: Home / Self Care | Payer: BC Managed Care – PPO | Source: Ambulatory Visit | Attending: General Surgery

## 2013-08-17 ENCOUNTER — Ambulatory Visit (HOSPITAL_COMMUNITY)
Admission: RE | Admit: 2013-08-17 | Discharge: 2013-08-17 | Disposition: A | Discharge status: Home / Self Care | Payer: BC Managed Care – PPO | Source: Ambulatory Visit | Attending: General Surgery | Admitting: General Surgery

## 2013-08-17 ENCOUNTER — Encounter (HOSPITAL_COMMUNITY): Payer: BC Managed Care – PPO | Admitting: Anesthesiology

## 2013-08-17 ENCOUNTER — Ambulatory Visit (HOSPITAL_COMMUNITY): Payer: BC Managed Care – PPO | Admitting: Anesthesiology

## 2013-08-17 DIAGNOSIS — K4090 Unilateral inguinal hernia, without obstruction or gangrene, not specified as recurrent: Secondary | ICD-10-CM | POA: Insufficient documentation

## 2013-08-17 DIAGNOSIS — Z79899 Other long term (current) drug therapy: Secondary | ICD-10-CM | POA: Insufficient documentation

## 2013-08-17 DIAGNOSIS — Z6833 Body mass index (BMI) 33.0-33.9, adult: Secondary | ICD-10-CM | POA: Insufficient documentation

## 2013-08-17 DIAGNOSIS — Z87891 Personal history of nicotine dependence: Secondary | ICD-10-CM | POA: Insufficient documentation

## 2013-08-17 DIAGNOSIS — K219 Gastro-esophageal reflux disease without esophagitis: Secondary | ICD-10-CM | POA: Insufficient documentation

## 2013-08-17 HISTORY — PX: INGUINAL HERNIA REPAIR: SHX194

## 2013-08-17 HISTORY — PX: INSERTION OF MESH: SHX5868

## 2013-08-17 SURGERY — REPAIR, HERNIA, INGUINAL, ADULT
Anesthesia: General | Site: Groin | Laterality: Right

## 2013-08-17 MED ORDER — KETOROLAC TROMETHAMINE 30 MG/ML IJ SOLN
30.0000 mg | Freq: Once | INTRAMUSCULAR | Status: AC
Start: 2013-08-17 — End: 2013-08-17
  Administered 2013-08-17: 30 mg via INTRAVENOUS
  Filled 2013-08-17: qty 1

## 2013-08-17 MED ORDER — BUPIVACAINE LIPOSOME 1.3 % IJ SUSP
INTRAMUSCULAR | Status: DC | PRN
  Administered 2013-08-17: 15 mL

## 2013-08-17 MED ORDER — ONDANSETRON HCL 4 MG/2ML IJ SOLN
INTRAMUSCULAR | Status: AC
  Filled 2013-08-17: qty 2

## 2013-08-17 MED ORDER — GLYCOPYRROLATE 0.2 MG/ML IJ SOLN
0.2000 mg | Freq: Once | INTRAMUSCULAR | Status: AC
  Administered 2013-08-17: 0.2 mg via INTRAVENOUS

## 2013-08-17 MED ORDER — FENTANYL CITRATE 0.05 MG/ML IJ SOLN
INTRAMUSCULAR | Status: AC
  Filled 2013-08-17: qty 2

## 2013-08-17 MED ORDER — FENTANYL CITRATE 0.05 MG/ML IJ SOLN
25.0000 ug | INTRAMUSCULAR | Status: DC | PRN
  Administered 2013-08-17 (×4): 50 ug via INTRAVENOUS

## 2013-08-17 MED ORDER — PROPOFOL 10 MG/ML IV BOLUS
INTRAVENOUS | Status: DC | PRN
  Administered 2013-08-17: 200 mg via INTRAVENOUS

## 2013-08-17 MED ORDER — SODIUM CHLORIDE 0.9 % IR SOLN
Status: DC | PRN
Start: 2013-08-17 — End: 2013-08-17
  Administered 2013-08-17: 1000 mL

## 2013-08-17 MED ORDER — PROPOFOL 10 MG/ML IV BOLUS
INTRAVENOUS | Status: AC
  Filled 2013-08-17: qty 20

## 2013-08-17 MED ORDER — DEXAMETHASONE SODIUM PHOSPHATE 10 MG/ML IJ SOLN
INTRAMUSCULAR | Status: DC | PRN
  Administered 2013-08-17: 10 mg via INTRAVENOUS

## 2013-08-17 MED ORDER — SUCCINYLCHOLINE CHLORIDE 20 MG/ML IJ SOLN
INTRAMUSCULAR | Status: AC
  Filled 2013-08-17: qty 1

## 2013-08-17 MED ORDER — GLYCOPYRROLATE 0.2 MG/ML IJ SOLN
INTRAMUSCULAR | Status: AC
  Filled 2013-08-17: qty 1

## 2013-08-17 MED ORDER — FENTANYL CITRATE 0.05 MG/ML IJ SOLN
INTRAMUSCULAR | Status: AC
  Filled 2013-08-17: qty 5

## 2013-08-17 MED ORDER — FENTANYL CITRATE 0.05 MG/ML IJ SOLN
INTRAMUSCULAR | Status: DC | PRN
  Administered 2013-08-17: 100 ug via INTRAVENOUS
  Administered 2013-08-17 (×4): 50 ug via INTRAVENOUS

## 2013-08-17 MED ORDER — DIPHENHYDRAMINE HCL 50 MG/ML IJ SOLN
INTRAMUSCULAR | Status: DC | PRN
  Administered 2013-08-17: 25 mg via INTRAVENOUS

## 2013-08-17 MED ORDER — ONDANSETRON HCL 4 MG/2ML IJ SOLN: 4.0000 mg | Freq: Once | INTRAMUSCULAR | Status: DC | PRN

## 2013-08-17 MED ORDER — LACTATED RINGERS IV SOLN
INTRAVENOUS | Status: DC
  Administered 2013-08-17: 07:00:00 via INTRAVENOUS

## 2013-08-17 MED ORDER — FENTANYL CITRATE 0.05 MG/ML IJ SOLN
25.0000 ug | INTRAMUSCULAR | Status: AC
  Administered 2013-08-17 (×2): 25 ug via INTRAVENOUS

## 2013-08-17 MED ORDER — OXYCODONE-ACETAMINOPHEN 7.5-325 MG PO TABS: 1.0000 | ORAL_TABLET | ORAL | Status: DC | PRN

## 2013-08-17 MED ORDER — BUPIVACAINE HCL (PF) 0.5 % IJ SOLN
INTRAMUSCULAR | Status: AC
  Filled 2013-08-17: qty 30

## 2013-08-17 MED ORDER — LIDOCAINE HCL (CARDIAC) 20 MG/ML IV SOLN
INTRAVENOUS | Status: DC | PRN
  Administered 2013-08-17: 50 mg via INTRAVENOUS

## 2013-08-17 MED ORDER — MIDAZOLAM HCL 2 MG/2ML IJ SOLN
INTRAMUSCULAR | Status: AC
  Filled 2013-08-17: qty 2

## 2013-08-17 MED ORDER — MIDAZOLAM HCL 2 MG/2ML IJ SOLN
1.0000 mg | INTRAMUSCULAR | Status: DC | PRN
  Administered 2013-08-17: 2 mg via INTRAVENOUS

## 2013-08-17 MED ORDER — DOCUSATE SODIUM 100 MG PO CAPS: 100.0000 mg | ORAL_CAPSULE | Freq: Two times a day (BID) | ORAL | Status: DC

## 2013-08-17 MED ORDER — SODIUM CHLORIDE 0.9 % IV SOLN
1500.0000 mg | INTRAVENOUS | Status: AC
  Administered 2013-08-17: 1500 mg via INTRAVENOUS
  Filled 2013-08-17: qty 1500

## 2013-08-17 MED ORDER — LACTATED RINGERS IV SOLN
INTRAVENOUS | Status: DC | PRN
  Administered 2013-08-17 (×2): via INTRAVENOUS

## 2013-08-17 MED ORDER — ONDANSETRON HCL 4 MG/2ML IJ SOLN
4.0000 mg | Freq: Once | INTRAMUSCULAR | Status: AC
  Administered 2013-08-17: 4 mg via INTRAVENOUS

## 2013-08-17 MED ORDER — LIDOCAINE HCL (PF) 1 % IJ SOLN
INTRAMUSCULAR | Status: AC
  Filled 2013-08-17: qty 10

## 2013-08-17 MED ORDER — BUPIVACAINE LIPOSOME 1.3 % IJ SUSP
20.0000 mL | Freq: Once | INTRAMUSCULAR | Status: DC
  Filled 2013-08-17: qty 20

## 2013-08-17 MED FILL — Bupivacaine HCl Preservative Free (PF) Inj 0.5%: INTRAMUSCULAR | Qty: 30 | Status: AC

## 2013-08-17 SURGICAL SUPPLY — 46 items
105640 IMPLANT
ADH SKN CLS APL DERMABOND .7 (GAUZE/BANDAGES/DRESSINGS) ×1
BAG HAMPER (MISCELLANEOUS) ×3 IMPLANT
CLOTH BEACON ORANGE TIMEOUT ST (SAFETY) ×3 IMPLANT
COVER LIGHT HANDLE STERIS (MISCELLANEOUS) ×6 IMPLANT
DECANTER SPIKE VIAL GLASS SM (MISCELLANEOUS) ×3 IMPLANT
DERMABOND ADVANCED (GAUZE/BANDAGES/DRESSINGS) ×2
DERMABOND ADVANCED .7 DNX12 (GAUZE/BANDAGES/DRESSINGS) ×1 IMPLANT
DRAIN PENROSE 18X1/2 LTX STRL (DRAIN) ×3 IMPLANT
ELECT REM PT RETURN 9FT ADLT (ELECTROSURGICAL) ×3
ELECTRODE REM PT RTRN 9FT ADLT (ELECTROSURGICAL) ×1 IMPLANT
FORMALIN 10 PREFIL 120ML (MISCELLANEOUS) IMPLANT
GLOVE BIOGEL PI IND STRL 8 (GLOVE) ×1 IMPLANT
GLOVE BIOGEL PI INDICATOR 8 (GLOVE) ×2
GLOVE ECLIPSE 6.5 STRL STRAW (GLOVE) ×2 IMPLANT
GLOVE ECLIPSE 7.0 STRL STRAW (GLOVE) ×2 IMPLANT
GLOVE ECLIPSE 7.5 STRL STRAW (GLOVE) ×3 IMPLANT
GLOVE INDICATOR 7.0 STRL GRN (GLOVE) ×2 IMPLANT
GLOVE INDICATOR 7.5 STRL GRN (GLOVE) ×4 IMPLANT
GOWN STRL REUS W/TWL LRG LVL3 (GOWN DISPOSABLE) ×9 IMPLANT
INST SET MINOR GENERAL (KITS) ×3 IMPLANT
KIT ROOM TURNOVER APOR (KITS) ×3 IMPLANT
MANIFOLD NEPTUNE II (INSTRUMENTS) ×3 IMPLANT
MESH HERNIA 1.6X1.9 PLUG LRG (Mesh General) IMPLANT
MESH HERNIA PLUG LRG (Mesh General) ×2 IMPLANT
NDL HYPO 18GX1.5 BLUNT FILL (NEEDLE) IMPLANT
NEEDLE HYPO 18GX1.5 BLUNT FILL (NEEDLE) ×3 IMPLANT
NS IRRIG 1000ML POUR BTL (IV SOLUTION) ×3 IMPLANT
PACK MINOR (CUSTOM PROCEDURE TRAY) ×3 IMPLANT
PAD ARMBOARD 7.5X6 YLW CONV (MISCELLANEOUS) ×3 IMPLANT
SET BASIN LINEN APH (SET/KITS/TRAYS/PACK) ×3 IMPLANT
SOL PREP PROV IODINE SCRUB 4OZ (MISCELLANEOUS) ×3 IMPLANT
SUT NOVA NAB GS-22 2 2-0 T-19 (SUTURE) ×8 IMPLANT
SUT NOVAFIL NAB HGS22 2-0 30IN (SUTURE) IMPLANT
SUT SILK 3 0 (SUTURE)
SUT SILK 3-0 18XBRD TIE 12 (SUTURE) IMPLANT
SUT VIC AB 2-0 CT1 27 (SUTURE) ×3
SUT VIC AB 2-0 CT1 TAPERPNT 27 (SUTURE) ×1 IMPLANT
SUT VIC AB 3-0 SH 27 (SUTURE) ×3
SUT VIC AB 3-0 SH 27X BRD (SUTURE) ×1 IMPLANT
SUT VIC AB 4-0 PS2 27 (SUTURE) ×3 IMPLANT
SUT VICRYL AB 3 0 TIES (SUTURE) IMPLANT
SYR 20CC LL (SYRINGE) ×2 IMPLANT
SYR 30ML LL (SYRINGE) ×2 IMPLANT
SYR CONTROL 10ML LL (SYRINGE) ×3 IMPLANT
TOWEL OR 17X26 4PK STRL BLUE (TOWEL DISPOSABLE) ×3 IMPLANT

## 2013-08-17 NOTE — Anesthesia Procedure Notes (Signed)
Procedure Name: LMA Insertion Date/Time: 08/17/2013 7:35 AM Performed by: Pernell DupreADAMS, AMY A Pre-anesthesia Checklist: Patient identified, Timeout performed, Emergency Drugs available, Suction available and Patient being monitored Patient Re-evaluated:Patient Re-evaluated prior to inductionOxygen Delivery Method: Circle system utilized Preoxygenation: Pre-oxygenation with 100% oxygen Intubation Type: IV induction Ventilation: Mask ventilation without difficulty LMA: LMA inserted LMA Size: 5.0 Number of attempts: 1 Placement Confirmation: positive ETCO2 and breath sounds checked- equal and bilateral Tube secured with: Tape Dental Injury: Teeth and Oropharynx as per pre-operative assessment

## 2013-08-17 NOTE — Transfer of Care (Signed)
Immediate Anesthesia Transfer of Care Note  Patient: Bobby [name]  Procedure(s) Performed: Procedure(s): RIGHT INGUINAL HERNIA REPAIR WITH MESH (Right)  Patient Location: PACU  Anesthesia Type:General  Level of Consciousness: sedated and patient cooperative  Airway & Oxygen Therapy: Patient Spontanous Breathing and Patient connected to face mask oxygen  Post-op Assessment: Report given to PACU RN and Post -op Vital signs reviewed and stable  Post vital signs: Reviewed and stable  Complications: No apparent anesthesia complications

## 2013-08-17 NOTE — Anesthesia Postprocedure Evaluation (Signed)
  Anesthesia Post-op Note  Patient: Bobby [name]  Procedure(s) Performed: Procedure(s): RIGHT INGUINAL HERNIA REPAIR WITH MESH (Right)  Patient Location: PACU  Anesthesia Type:General  Level of Consciousness: sedated and patient cooperative  Airway and Oxygen Therapy: Patient Spontanous Breathing and Patient connected to face mask oxygen  Post-op Pain: mild  Post-op Assessment: Post-op Vital signs reviewed, Patient's Cardiovascular Status Stable, Respiratory Function Stable, Patent Airway, No signs of Nausea or vomiting and Pain level controlled  Post-op Vital Signs: Reviewed and stable  Last Vitals:  Filed Vitals:   08/17/13 0830  BP:   Temp: 36.6 C  Resp:     Complications: No apparent anesthesia complications

## 2013-08-17 NOTE — Anesthesia Preprocedure Evaluation (Signed)
Anesthesia Evaluation  Patient identified by MRN, date of birth, ID band Patient awake    Reviewed: Allergy & Precautions, H&P , NPO status , Patient's Chart, lab work & pertinent test results  Airway Mallampati: I TM Distance: >3 FB Neck ROM: Full    Dental  (+) Teeth Intact   Pulmonary former smoker,  breath sounds clear to auscultation        Cardiovascular negative cardio ROS  Rhythm:Regular     Neuro/Psych    GI/Hepatic GERD-  Medicated and Controlled,  Endo/Other    Renal/GU      Musculoskeletal   Abdominal   Peds  Hematology   Anesthesia Other Findings   Reproductive/Obstetrics                           Anesthesia Physical Anesthesia Plan  ASA: II  Anesthesia Plan: General   Post-op Pain Management:    Induction: Intravenous  Airway Management Planned: LMA  Additional Equipment:   Intra-op Plan:   Post-operative Plan: Extubation in OR  Informed Consent: I have reviewed the patients History and Physical, chart, labs and discussed the procedure including the risks, benefits and alternatives for the proposed anesthesia with the patient or authorized representative who has indicated his/her understanding and acceptance.     Plan Discussed with:   Anesthesia Plan Comments:         Anesthesia Quick Evaluation

## 2013-08-17 NOTE — Discharge Instructions (Signed)
Inguinal Hernia, Adult  °Care After °Refer to this sheet in the next few weeks. These discharge instructions provide you with general information on caring for yourself after you leave the hospital. Your caregiver may also give you specific instructions. Your treatment has been planned according to the most current medical practices available, but unavoidable complications sometimes occur. If you have any problems or questions after discharge, please call your caregiver. °HOME CARE INSTRUCTIONS °· Put ice on the operative site. °· Put ice in a plastic bag. °· Place a towel between your skin and the bag. °· Leave the ice on for 15-20 minutes at a time, 03-04 times a day while awake. °· Change bandages (dressings) as directed. °· Keep the wound dry and clean. The wound may be washed gently with soap and water. Gently blot or dab the wound dry. It is okay to take showers 24 to 48 hours after surgery. Do not take baths, use swimming pools, or use hot tubs for 10 days, or as directed by your caregiver. °· Only take over-the-counter or prescription medicines for pain, discomfort, or fever as directed by your caregiver. °· Continue your normal diet as directed. °· Do not lift anything more than 10 pounds or play contact sports for 3 weeks, or as directed. °SEEK MEDICAL CARE IF: °· There is redness, swelling, or increasing pain in the wound. °· There is fluid (pus) coming from the wound. °· There is drainage from a wound lasting longer than 1 day. °· You have an oral temperature above 102° F (38.9° C). °· You notice a bad smell coming from the wound or dressing. °· The wound breaks open after the stitches (sutures) have been removed. °· You notice increasing pain in the shoulders (shoulder strap areas). °· You develop dizzy episodes or fainting while standing. °· You feel sick to your stomach (nauseous) or throw up (vomit). °SEEK IMMEDIATE MEDICAL CARE IF: °· You develop a rash. °· You have difficulty breathing. °· You  develop a reaction or have side effects to medicines you were given. °MAKE SURE YOU:  °· Understand these instructions. °· Will watch your condition. °· Will get help right away if you are not doing well or get worse. °Document Released: 05/10/2006 Document Revised: 07/02/2011 Document Reviewed: 03/09/2009 °ExitCare® Patient Information ©2014 [employer]. ° °

## 2013-08-17 NOTE — Op Note (Signed)
Patient:  Bobby [name]  DOB:  07-02-1975  MRN:  [id number]   Preop Diagnosis:  Right inguinal hernia  Postop Diagnosis:  Same  Procedure:  Right inguinal herniorrhaphy with mesh  Surgeon:  Franky MachoMark Maddalyn Lutze, M.D.  Anes:  General  Indications:  Patient is a 38 year old white male presents with a symptomatic right inguinal hernia. The risks and benefits of the procedure including bleeding, infection, chronic pain, and recurrence of the hernia were fully explained to the patient, who gave informed consent.  Procedure note:  The patient was placed in the supine position. After general anesthesia was administered, the right groin region was prepped and draped using usual sterile technique with Betadine. Surgical site confirmation was performed.  A transverse incision was made in the right coronary down to the external oblique aponeuroses. The aponeuroses was incised to the external ring. A Penrose drain was placed around the spermatic cord. The vas deferens was noted within the spermatic cord. A small lipoma of the cord was excised. The patient was noted have a direct hernia. The hernia was incised at its base and inverted. A large size Perfix Bard plug was then inserted into this region and secured to the transversalis fascia using 2-0 Novafil interrupted sutures. An onlay patch was then placed along the floor of the inguinal canal and secured superiorly to the conjoined tendon and inferiorly to the shelving edge of Poupart's ligament using 20 Novafil interrupted sutures. The internal ring was recreated using a 2-0 Novafil interrupted suture. The external oblique aponeuroses was reapproximated using a 2-0 Vicryl running suture. The subcutaneous layer was reapproximated using 3-0 Vicryl interrupted suture. The skin was closed using 4-0 Vicryl subcuticular suture. Exparel was then instilled into the surrounding wound. Dermabond was then applied.  All tape and needle counts were correct at the end of  the procedure. Patient was extubated in the operating room and transferred to PACU in stable condition.  Complications:  None  EBL:  Minimal  Specimen:  None

## 2013-08-17 NOTE — Interval H&P Note (Signed)
History and Physical Interval Note:  08/17/2013 7:18 AM  Bobby [name]  has presented today for surgery, with the diagnosis of right inguinal hernia  The various methods of treatment have been discussed with the patient and family. After consideration of risks, benefits and other options for treatment, the patient has consented to  Procedure(s): RIGHT INGUINAL HERNIA REPAIR WITH MESH (Right) as a surgical intervention .  The patient's history has been reviewed, patient examined, no change in status, stable for surgery.  I have reviewed the patient's chart and labs.  Questions were answered to the patient's satisfaction.     Dalia HeadingMark A Eda Magnussen

## 2013-08-18 ENCOUNTER — Encounter (HOSPITAL_COMMUNITY): Payer: Self-pay | Admitting: General Surgery

## 2015-12-14 ENCOUNTER — Ambulatory Visit: Payer: BLUE CROSS/BLUE SHIELD | Admitting: Orthopaedic Surgery

## 2015-12-14 ENCOUNTER — Encounter: Payer: Self-pay | Admitting: Orthopaedic Surgery

## 2016-01-27 ENCOUNTER — Other Ambulatory Visit (HOSPITAL_COMMUNITY): Payer: Self-pay | Admitting: Orthopedic Surgery

## 2016-01-27 DIAGNOSIS — M25511 Pain in right shoulder: Secondary | ICD-10-CM

## 2016-02-01 ENCOUNTER — Ambulatory Visit (HOSPITAL_COMMUNITY)
Admission: RE | Admit: 2016-02-01 | Discharge: 2016-02-01 | Disposition: A | Discharge status: Home / Self Care | Payer: BLUE CROSS/BLUE SHIELD | Source: Ambulatory Visit | Attending: Orthopedic Surgery | Admitting: Orthopedic Surgery

## 2016-02-01 DIAGNOSIS — M25551 Pain in right hip: Secondary | ICD-10-CM | POA: Diagnosis not present

## 2016-02-01 DIAGNOSIS — R937 Abnormal findings on diagnostic imaging of other parts of musculoskeletal system: Secondary | ICD-10-CM | POA: Diagnosis not present

## 2016-02-01 DIAGNOSIS — M25511 Pain in right shoulder: Secondary | ICD-10-CM

## 2017-09-18 DIAGNOSIS — K080 Exfoliation of teeth due to systemic causes: Secondary | ICD-10-CM | POA: Diagnosis not present

## 2018-11-06 ENCOUNTER — Encounter (HOSPITAL_COMMUNITY): Payer: Self-pay | Admitting: Pharmacy Technician

## 2018-11-06 ENCOUNTER — Emergency Department (HOSPITAL_COMMUNITY): Payer: No Typology Code available for payment source

## 2018-11-06 ENCOUNTER — Observation Stay (HOSPITAL_COMMUNITY)
Admission: EM | Admit: 2018-11-06 | Discharge: 2018-11-07 | Disposition: A | Discharge status: Home / Self Care | Payer: No Typology Code available for payment source | Attending: Student | Admitting: Student

## 2018-11-06 ENCOUNTER — Other Ambulatory Visit: Payer: Self-pay

## 2018-11-06 ENCOUNTER — Observation Stay (HOSPITAL_COMMUNITY): Payer: No Typology Code available for payment source

## 2018-11-06 DIAGNOSIS — Z20828 Contact with and (suspected) exposure to other viral communicable diseases: Secondary | ICD-10-CM | POA: Insufficient documentation

## 2018-11-06 DIAGNOSIS — S82142A Displaced bicondylar fracture of left tibia, initial encounter for closed fracture: Secondary | ICD-10-CM | POA: Diagnosis not present

## 2018-11-06 DIAGNOSIS — S82109A Unspecified fracture of upper end of unspecified tibia, initial encounter for closed fracture: Secondary | ICD-10-CM | POA: Diagnosis not present

## 2018-11-06 DIAGNOSIS — S79921A Unspecified injury of right thigh, initial encounter: Secondary | ICD-10-CM | POA: Diagnosis not present

## 2018-11-06 DIAGNOSIS — S0990XA Unspecified injury of head, initial encounter: Secondary | ICD-10-CM | POA: Diagnosis not present

## 2018-11-06 DIAGNOSIS — S83411A Sprain of medial collateral ligament of right knee, initial encounter: Secondary | ICD-10-CM | POA: Diagnosis not present

## 2018-11-06 DIAGNOSIS — S0003XA Contusion of scalp, initial encounter: Secondary | ICD-10-CM | POA: Diagnosis not present

## 2018-11-06 DIAGNOSIS — R404 Transient alteration of awareness: Secondary | ICD-10-CM | POA: Diagnosis not present

## 2018-11-06 DIAGNOSIS — Z419 Encounter for procedure for purposes other than remedying health state, unspecified: Secondary | ICD-10-CM

## 2018-11-06 DIAGNOSIS — S3993XA Unspecified injury of pelvis, initial encounter: Secondary | ICD-10-CM | POA: Diagnosis not present

## 2018-11-06 DIAGNOSIS — R52 Pain, unspecified: Secondary | ICD-10-CM | POA: Diagnosis not present

## 2018-11-06 DIAGNOSIS — S83281A Other tear of lateral meniscus, current injury, right knee, initial encounter: Secondary | ICD-10-CM

## 2018-11-06 DIAGNOSIS — S299XXA Unspecified injury of thorax, initial encounter: Secondary | ICD-10-CM | POA: Diagnosis not present

## 2018-11-06 DIAGNOSIS — T1490XA Injury, unspecified, initial encounter: Secondary | ICD-10-CM

## 2018-11-06 DIAGNOSIS — S199XXA Unspecified injury of neck, initial encounter: Secondary | ICD-10-CM | POA: Diagnosis not present

## 2018-11-06 DIAGNOSIS — S3991XA Unspecified injury of abdomen, initial encounter: Secondary | ICD-10-CM | POA: Diagnosis not present

## 2018-11-06 DIAGNOSIS — Z23 Encounter for immunization: Secondary | ICD-10-CM | POA: Diagnosis not present

## 2018-11-06 DIAGNOSIS — S82121A Displaced fracture of lateral condyle of right tibia, initial encounter for closed fracture: Secondary | ICD-10-CM | POA: Diagnosis not present

## 2018-11-06 DIAGNOSIS — M25562 Pain in left knee: Secondary | ICD-10-CM | POA: Diagnosis not present

## 2018-11-06 DIAGNOSIS — R402421 Glasgow coma scale score 9-12, in the field [EMT or ambulance]: Secondary | ICD-10-CM | POA: Diagnosis not present

## 2018-11-06 DIAGNOSIS — S82141A Displaced bicondylar fracture of right tibia, initial encounter for closed fracture: Secondary | ICD-10-CM | POA: Diagnosis not present

## 2018-11-06 DIAGNOSIS — Z03818 Encounter for observation for suspected exposure to other biological agents ruled out: Secondary | ICD-10-CM | POA: Diagnosis not present

## 2018-11-06 DIAGNOSIS — R4182 Altered mental status, unspecified: Secondary | ICD-10-CM | POA: Diagnosis not present

## 2018-11-06 DIAGNOSIS — S8992XA Unspecified injury of left lower leg, initial encounter: Secondary | ICD-10-CM | POA: Diagnosis not present

## 2018-11-06 LAB — CBC
HCT: 43.4 % (ref 39.0–52.0)
Hemoglobin: 15.2 g/dL (ref 13.0–17.0)
MCH: 30.8 pg (ref 26.0–34.0)
MCHC: 35 g/dL (ref 30.0–36.0)
MCV: 87.9 fL (ref 80.0–100.0)
Platelets: 261 10*3/uL (ref 150–400)
RBC: 4.94 MIL/uL (ref 4.22–5.81)
RDW: 12.2 % (ref 11.5–15.5)
WBC: 11.7 10*3/uL — ABNORMAL HIGH (ref 4.0–10.5)
nRBC: 0 % (ref 0.0–0.2)

## 2018-11-06 LAB — I-STAT CHEM 8, ED
BUN: 21 mg/dL — ABNORMAL HIGH (ref 6–20)
Calcium, Ion: 1.11 mmol/L — ABNORMAL LOW (ref 1.15–1.40)
Chloride: 103 mmol/L (ref 98–111)
Creatinine, Ser: 1.4 mg/dL — ABNORMAL HIGH (ref 0.61–1.24)
Glucose, Bld: 105 mg/dL — ABNORMAL HIGH (ref 70–99)
HCT: 41 % (ref 39.0–52.0)
Hemoglobin: 13.9 g/dL (ref 13.0–17.0)
Potassium: 3.8 mmol/L (ref 3.5–5.1)
Sodium: 139 mmol/L (ref 135–145)
TCO2: 26 mmol/L (ref 22–32)

## 2018-11-06 LAB — SAMPLE TO BLOOD BANK

## 2018-11-06 LAB — COMPREHENSIVE METABOLIC PANEL
ALT: 44 U/L (ref 0–44)
AST: 40 U/L (ref 15–41)
Albumin: 4 g/dL (ref 3.5–5.0)
Alkaline Phosphatase: 41 U/L (ref 38–126)
Anion gap: 12 (ref 5–15)
BUN: 17 mg/dL (ref 6–20)
CO2: 22 mmol/L (ref 22–32)
Calcium: 8.7 mg/dL — ABNORMAL LOW (ref 8.9–10.3)
Chloride: 105 mmol/L (ref 98–111)
Creatinine, Ser: 1.5 mg/dL — ABNORMAL HIGH (ref 0.61–1.24)
GFR calc Af Amer: >60 mL/min (ref >60)
GFR calc non Af Amer: 57 mL/min — ABNORMAL LOW (ref >60)
Glucose, Bld: 110 mg/dL — ABNORMAL HIGH (ref 70–99)
Potassium: 3.6 mmol/L (ref 3.5–5.1)
Sodium: 139 mmol/L (ref 135–145)
Total Bilirubin: 0.8 mg/dL (ref 0.3–1.2)
Total Protein: 6.3 g/dL — ABNORMAL LOW (ref 6.5–8.1)

## 2018-11-06 LAB — URINALYSIS, ROUTINE W REFLEX MICROSCOPIC
Bilirubin Urine: NEGATIVE
Glucose, UA: NEGATIVE mg/dL
Ketones, ur: NEGATIVE mg/dL
Leukocytes,Ua: NEGATIVE
Nitrite: NEGATIVE
Protein, ur: 30 mg/dL — AB
Specific Gravity, Urine: 1.042 — ABNORMAL HIGH (ref 1.005–1.030)
pH: 6 (ref 5.0–8.0)

## 2018-11-06 LAB — PROTIME-INR
INR: 1.1 (ref 0.8–1.2)
Prothrombin Time: 13.8 seconds (ref 11.4–15.2)

## 2018-11-06 LAB — LACTIC ACID, PLASMA: Lactic Acid, Venous: 2.5 mmol/L (ref 0.5–1.9)

## 2018-11-06 LAB — ETHANOL: Alcohol, Ethyl (B): <10 mg/dL (ref <10)

## 2018-11-06 LAB — CDS SEROLOGY

## 2018-11-06 MED ORDER — METHOCARBAMOL 1000 MG/10ML IJ SOLN
500.0000 mg | Freq: Four times a day (QID) | INTRAVENOUS | Status: DC | PRN
  Administered 2018-11-07: 500 mg via INTRAVENOUS
  Filled 2018-11-06 (×3): qty 5

## 2018-11-06 MED ORDER — ONDANSETRON HCL 4 MG PO TABS
4.0000 mg | ORAL_TABLET | Freq: Four times a day (QID) | ORAL | Status: DC | PRN
  Filled 2018-11-06: qty 1

## 2018-11-06 MED ORDER — METHOCARBAMOL 500 MG PO TABS: 500.0000 mg | ORAL_TABLET | Freq: Four times a day (QID) | ORAL | Status: DC | PRN

## 2018-11-06 MED ORDER — OXYCODONE HCL 5 MG PO TABS
10.0000 mg | ORAL_TABLET | ORAL | Status: DC | PRN
  Administered 2018-11-07 (×2): 15 mg via ORAL
  Filled 2018-11-06: qty 3

## 2018-11-06 MED ORDER — SODIUM CHLORIDE 0.9 % IV BOLUS
1000.0000 mL | Freq: Once | INTRAVENOUS | Status: AC
  Administered 2018-11-06: 1000 mL via INTRAVENOUS

## 2018-11-06 MED ORDER — TETANUS-DIPHTH-ACELL PERTUSSIS 5-2.5-18.5 LF-MCG/0.5 IM SUSP
0.5000 mL | Freq: Once | INTRAMUSCULAR | Status: AC
  Administered 2018-11-06: 0.5 mL via INTRAMUSCULAR
  Filled 2018-11-06: qty 0.5

## 2018-11-06 MED ORDER — DIPHENHYDRAMINE HCL 12.5 MG/5ML PO ELIX: 12.5000 mg | ORAL_SOLUTION | ORAL | Status: DC | PRN

## 2018-11-06 MED ORDER — HYDROMORPHONE HCL 1 MG/ML IJ SOLN
INTRAMUSCULAR | Status: AC
  Administered 2018-11-06: 1 mg
  Filled 2018-11-06: qty 1

## 2018-11-06 MED ORDER — OXYCODONE HCL 5 MG PO TABS
5.0000 mg | ORAL_TABLET | ORAL | Status: DC | PRN
  Administered 2018-11-07: 10 mg via ORAL
  Filled 2018-11-06: qty 2

## 2018-11-06 MED ORDER — ACETAMINOPHEN 500 MG PO TABS
1000.0000 mg | ORAL_TABLET | Freq: Four times a day (QID) | ORAL | Status: DC
  Administered 2018-11-07 (×3): 1000 mg via ORAL
  Filled 2018-11-06 (×3): qty 2

## 2018-11-06 MED ORDER — HYDROMORPHONE HCL 1 MG/ML IJ SOLN
0.5000 mg | INTRAMUSCULAR | Status: DC | PRN
  Administered 2018-11-07 (×2): 1 mg via INTRAVENOUS
  Filled 2018-11-06 (×2): qty 1

## 2018-11-06 MED ORDER — IOHEXOL 300 MG/ML  SOLN
100.0000 mL | Freq: Once | INTRAMUSCULAR | Status: AC | PRN
  Administered 2018-11-06: 100 mL via INTRAVENOUS

## 2018-11-06 MED ORDER — DOCUSATE SODIUM 100 MG PO CAPS
100.0000 mg | ORAL_CAPSULE | Freq: Two times a day (BID) | ORAL | Status: DC
  Administered 2018-11-07: 100 mg via ORAL
  Filled 2018-11-06: qty 1

## 2018-11-06 MED ORDER — ONDANSETRON HCL 4 MG/2ML IJ SOLN: 4.0000 mg | Freq: Four times a day (QID) | INTRAMUSCULAR | Status: DC | PRN

## 2018-11-06 MED ORDER — HYDROMORPHONE HCL 1 MG/ML IJ SOLN
1.0000 mg | Freq: Once | INTRAMUSCULAR | Status: AC
  Administered 2018-11-06: 1 mg via INTRAVENOUS
  Filled 2018-11-06: qty 1

## 2018-11-06 MED ORDER — ACETAMINOPHEN 325 MG PO TABS: 325.0000 mg | ORAL_TABLET | Freq: Four times a day (QID) | ORAL | Status: DC | PRN

## 2018-11-06 NOTE — ED Notes (Signed)
Patient transported to X-ray 

## 2018-11-06 NOTE — ED Notes (Signed)
Pt transported to CT ?

## 2018-11-06 NOTE — Progress Notes (Signed)
Reviewed imaging. 43 yo male with right tibial plateau fracture. Recommend ORIF. Plan for surgery tomorrow AM. NPO past midnight.  Shona Needles, MD [facility] [phone] (phone) [phone] (office) [email or web address]

## 2018-11-06 NOTE — Progress Notes (Signed)
Chaplain called ED bridge and will be available if needed. Rev. Tamsen Snider Pager [phone]
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
## 2018-11-06 NOTE — ED Notes (Signed)
Pt aware of need for urine. States unable to void at this time.
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
## 2018-11-06 NOTE — ED Notes (Signed)
[place] Wife Please call for information.

## 2018-11-07 ENCOUNTER — Observation Stay (HOSPITAL_COMMUNITY): Payer: No Typology Code available for payment source | Admitting: Certified Registered Nurse Anesthetist

## 2018-11-07 ENCOUNTER — Observation Stay (HOSPITAL_COMMUNITY): Payer: No Typology Code available for payment source

## 2018-11-07 ENCOUNTER — Encounter (HOSPITAL_COMMUNITY)
Admission: EM | Disposition: A | Discharge status: Home / Self Care | Payer: Self-pay | Source: Home / Self Care | Attending: Emergency Medicine

## 2018-11-07 ENCOUNTER — Encounter (HOSPITAL_COMMUNITY): Payer: Self-pay | Admitting: Certified Registered Nurse Anesthetist

## 2018-11-07 DIAGNOSIS — S82121A Displaced fracture of lateral condyle of right tibia, initial encounter for closed fracture: Secondary | ICD-10-CM | POA: Diagnosis not present

## 2018-11-07 DIAGNOSIS — S83281A Other tear of lateral meniscus, current injury, right knee, initial encounter: Secondary | ICD-10-CM | POA: Diagnosis not present

## 2018-11-07 DIAGNOSIS — S82141A Displaced bicondylar fracture of right tibia, initial encounter for closed fracture: Secondary | ICD-10-CM | POA: Diagnosis not present

## 2018-11-07 DIAGNOSIS — S82109A Unspecified fracture of upper end of unspecified tibia, initial encounter for closed fracture: Secondary | ICD-10-CM | POA: Diagnosis not present

## 2018-11-07 DIAGNOSIS — S83411A Sprain of medial collateral ligament of right knee, initial encounter: Secondary | ICD-10-CM | POA: Diagnosis not present

## 2018-11-07 HISTORY — PX: ORIF TIBIA PLATEAU: SHX2132

## 2018-11-07 LAB — SARS CORONAVIRUS 2 BY RT PCR (HOSPITAL ORDER, PERFORMED IN ~~LOC~~ HOSPITAL LAB): SARS Coronavirus 2: NEGATIVE

## 2018-11-07 LAB — HIV ANTIBODY (ROUTINE TESTING W REFLEX): HIV Screen 4th Generation wRfx: NONREACTIVE

## 2018-11-07 LAB — SURGICAL PCR SCREEN
MRSA, PCR: NEGATIVE
Staphylococcus aureus: NEGATIVE

## 2018-11-07 SURGERY — OPEN REDUCTION INTERNAL FIXATION (ORIF) TIBIAL PLATEAU
Anesthesia: General | Laterality: Right

## 2018-11-07 MED ORDER — OXYCODONE HCL 5 MG PO TABS: 5.0000 mg | ORAL_TABLET | ORAL | 0 refills | Status: DC | PRN

## 2018-11-07 MED ORDER — 0.9 % SODIUM CHLORIDE (POUR BTL) OPTIME
TOPICAL | Status: DC | PRN
  Administered 2018-11-07: 1000 mL

## 2018-11-07 MED ORDER — PROPOFOL 10 MG/ML IV BOLUS
INTRAVENOUS | Status: DC | PRN
  Administered 2018-11-07: 160 mg via INTRAVENOUS

## 2018-11-07 MED ORDER — KETOROLAC TROMETHAMINE 15 MG/ML IJ SOLN
15.0000 mg | Freq: Four times a day (QID) | INTRAMUSCULAR | Status: DC
  Administered 2018-11-07: 15 mg via INTRAVENOUS
  Filled 2018-11-07: qty 1

## 2018-11-07 MED ORDER — OXYCODONE HCL 5 MG PO TABS
ORAL_TABLET | ORAL | Status: AC
  Filled 2018-11-07: qty 3

## 2018-11-07 MED ORDER — ACETAMINOPHEN 325 MG PO TABS: 325.0000 mg | ORAL_TABLET | Freq: Once | ORAL | Status: DC | PRN

## 2018-11-07 MED ORDER — VANCOMYCIN HCL 1000 MG IV SOLR
INTRAVENOUS | Status: AC
  Filled 2018-11-07: qty 1000

## 2018-11-07 MED ORDER — MIDAZOLAM HCL 2 MG/2ML IJ SOLN
INTRAMUSCULAR | Status: DC | PRN
  Administered 2018-11-07: 2 mg via INTRAVENOUS

## 2018-11-07 MED ORDER — HYDROMORPHONE HCL 1 MG/ML IJ SOLN
0.2500 mg | INTRAMUSCULAR | Status: DC | PRN
  Administered 2018-11-07 (×4): 0.5 mg via INTRAVENOUS

## 2018-11-07 MED ORDER — ACETAMINOPHEN 160 MG/5ML PO SOLN: 325.0000 mg | Freq: Once | ORAL | Status: DC | PRN

## 2018-11-07 MED ORDER — FENTANYL CITRATE (PF) 250 MCG/5ML IJ SOLN
INTRAMUSCULAR | Status: AC
  Filled 2018-11-07: qty 5

## 2018-11-07 MED ORDER — ONDANSETRON HCL 4 MG/2ML IJ SOLN
INTRAMUSCULAR | Status: DC | PRN
  Administered 2018-11-07: 4 mg via INTRAVENOUS

## 2018-11-07 MED ORDER — ACETAMINOPHEN 10 MG/ML IV SOLN
1000.0000 mg | Freq: Once | INTRAVENOUS | Status: DC | PRN
  Administered 2018-11-07: 1000 mg via INTRAVENOUS

## 2018-11-07 MED ORDER — SUCCINYLCHOLINE CHLORIDE 200 MG/10ML IV SOSY
PREFILLED_SYRINGE | INTRAVENOUS | Status: DC | PRN
  Administered 2018-11-07: 140 mg via INTRAVENOUS

## 2018-11-07 MED ORDER — MEPERIDINE HCL 25 MG/ML IJ SOLN: 6.2500 mg | INTRAMUSCULAR | Status: DC | PRN

## 2018-11-07 MED ORDER — VANCOMYCIN HCL 1000 MG IV SOLR
INTRAVENOUS | Status: DC | PRN
  Administered 2018-11-07: 1000 mg via TOPICAL

## 2018-11-07 MED ORDER — ASPIRIN 325 MG PO TABS: 325.0000 mg | ORAL_TABLET | Freq: Every day | ORAL | 0 refills | Status: DC

## 2018-11-07 MED ORDER — GABAPENTIN 100 MG PO CAPS: 100.0000 mg | ORAL_CAPSULE | Freq: Three times a day (TID) | ORAL | 0 refills | Status: DC

## 2018-11-07 MED ORDER — CEFAZOLIN SODIUM-DEXTROSE 2-3 GM-%(50ML) IV SOLR
INTRAVENOUS | Status: DC | PRN
  Administered 2018-11-07: 2 g via INTRAVENOUS

## 2018-11-07 MED ORDER — METHOCARBAMOL 500 MG PO TABS: 500.0000 mg | ORAL_TABLET | Freq: Four times a day (QID) | ORAL | 0 refills | Status: DC | PRN

## 2018-11-07 MED ORDER — KETAMINE HCL 10 MG/ML IJ SOLN
INTRAMUSCULAR | Status: DC | PRN
  Administered 2018-11-07: 10 mg via INTRAVENOUS
  Administered 2018-11-07: 50 mg via INTRAVENOUS

## 2018-11-07 MED ORDER — LIDOCAINE 2% (20 MG/ML) 5 ML SYRINGE
INTRAMUSCULAR | Status: DC | PRN
  Administered 2018-11-07: 100 mg via INTRAVENOUS

## 2018-11-07 MED ORDER — ASPIRIN 325 MG PO TABS: 325.0000 mg | ORAL_TABLET | Freq: Every day | ORAL | Status: DC

## 2018-11-07 MED ORDER — ROCURONIUM BROMIDE 10 MG/ML (PF) SYRINGE
PREFILLED_SYRINGE | INTRAVENOUS | Status: DC | PRN
  Administered 2018-11-07: 20 mg via INTRAVENOUS
  Administered 2018-11-07: 50 mg via INTRAVENOUS
  Administered 2018-11-07: 20 mg via INTRAVENOUS

## 2018-11-07 MED ORDER — KETAMINE HCL 50 MG/5ML IJ SOSY
PREFILLED_SYRINGE | INTRAMUSCULAR | Status: AC
  Filled 2018-11-07: qty 10

## 2018-11-07 MED ORDER — MIDAZOLAM HCL 2 MG/2ML IJ SOLN
INTRAMUSCULAR | Status: AC
  Filled 2018-11-07: qty 2

## 2018-11-07 MED ORDER — DEXAMETHASONE SODIUM PHOSPHATE 10 MG/ML IJ SOLN
INTRAMUSCULAR | Status: DC | PRN
  Administered 2018-11-07: 5 mg via INTRAVENOUS

## 2018-11-07 MED ORDER — HYDROMORPHONE HCL 1 MG/ML IJ SOLN
INTRAMUSCULAR | Status: AC
  Filled 2018-11-07: qty 2

## 2018-11-07 MED ORDER — DEXAMETHASONE SODIUM PHOSPHATE 10 MG/ML IJ SOLN
INTRAMUSCULAR | Status: AC
  Filled 2018-11-07: qty 2

## 2018-11-07 MED ORDER — LACTATED RINGERS IV SOLN: INTRAVENOUS | Status: DC

## 2018-11-07 MED ORDER — ONDANSETRON HCL 4 MG/2ML IJ SOLN
INTRAMUSCULAR | Status: AC
  Filled 2018-11-07: qty 4

## 2018-11-07 MED ORDER — LACTATED RINGERS IV SOLN
INTRAVENOUS | Status: DC
  Administered 2018-11-07 (×2): via INTRAVENOUS

## 2018-11-07 MED ORDER — PHENYLEPHRINE 40 MCG/ML (10ML) SYRINGE FOR IV PUSH (FOR BLOOD PRESSURE SUPPORT)
PREFILLED_SYRINGE | INTRAVENOUS | Status: DC | PRN
  Administered 2018-11-07: 80 ug via INTRAVENOUS
  Administered 2018-11-07: 160 ug via INTRAVENOUS

## 2018-11-07 MED ORDER — PHENYLEPHRINE 40 MCG/ML (10ML) SYRINGE FOR IV PUSH (FOR BLOOD PRESSURE SUPPORT)
PREFILLED_SYRINGE | INTRAVENOUS | Status: AC
  Filled 2018-11-07: qty 10

## 2018-11-07 MED ORDER — FENTANYL CITRATE (PF) 250 MCG/5ML IJ SOLN
INTRAMUSCULAR | Status: DC | PRN
  Administered 2018-11-07: 150 ug via INTRAVENOUS

## 2018-11-07 MED ORDER — ROCURONIUM BROMIDE 10 MG/ML (PF) SYRINGE
PREFILLED_SYRINGE | INTRAVENOUS | Status: AC
  Filled 2018-11-07: qty 20

## 2018-11-07 MED ORDER — SUGAMMADEX SODIUM 200 MG/2ML IV SOLN
INTRAVENOUS | Status: DC | PRN
  Administered 2018-11-07: 200 mg via INTRAVENOUS

## 2018-11-07 MED ORDER — HYDROMORPHONE HCL 1 MG/ML IJ SOLN: 1.0000 mg | INTRAMUSCULAR | Status: DC | PRN

## 2018-11-07 MED ORDER — KETOROLAC TROMETHAMINE 15 MG/ML IJ SOLN
INTRAMUSCULAR | Status: AC
  Filled 2018-11-07: qty 1

## 2018-11-07 MED ORDER — SUCCINYLCHOLINE CHLORIDE 200 MG/10ML IV SOSY
PREFILLED_SYRINGE | INTRAVENOUS | Status: AC
  Filled 2018-11-07: qty 20

## 2018-11-07 MED ORDER — DEXMEDETOMIDINE HCL IN NACL 200 MCG/50ML IV SOLN
INTRAVENOUS | Status: DC | PRN
  Administered 2018-11-07: 40 ug via INTRAVENOUS

## 2018-11-07 MED ORDER — ACETAMINOPHEN 10 MG/ML IV SOLN
INTRAVENOUS | Status: AC
  Filled 2018-11-07: qty 100

## 2018-11-07 MED ORDER — PROMETHAZINE HCL 25 MG/ML IJ SOLN: 6.2500 mg | INTRAMUSCULAR | Status: DC | PRN

## 2018-11-07 MED ORDER — LIDOCAINE 2% (20 MG/ML) 5 ML SYRINGE
INTRAMUSCULAR | Status: AC
  Filled 2018-11-07: qty 15

## 2018-11-07 SURGICAL SUPPLY — 85 items
APL PRP STRL LF DISP 70% ISPRP (MISCELLANEOUS) ×2
APL SKNCLS STERI-STRIP NONHPOA (GAUZE/BANDAGES/DRESSINGS) ×1
BANDAGE ACE 6X5 VEL STRL LF (GAUZE/BANDAGES/DRESSINGS) ×1 IMPLANT
BANDAGE ESMARK 6X9 LF (GAUZE/BANDAGES/DRESSINGS) ×1 IMPLANT
BENZOIN TINCTURE PRP APPL 2/3 (GAUZE/BANDAGES/DRESSINGS) ×2 IMPLANT
BIT DRILL 2.5 X LONG (BIT) ×1
BIT DRILL CALIBR QC 2.8X250 (BIT) ×2 IMPLANT
BIT DRILL X LONG 2.5 (BIT) IMPLANT
BLADE CLIPPER SURG (BLADE) ×2 IMPLANT
BLADE SURG 15 STRL LF DISP TIS (BLADE) ×1 IMPLANT
BLADE SURG 15 STRL SS (BLADE)
BNDG CMPR 9X6 STRL LF SNTH (GAUZE/BANDAGES/DRESSINGS) ×1
BNDG CMPR MED 10X6 ELC LF (GAUZE/BANDAGES/DRESSINGS) ×1
BNDG ELASTIC 4X5.8 VLCR STR LF (GAUZE/BANDAGES/DRESSINGS) ×1 IMPLANT
BNDG ELASTIC 6X10 VLCR STRL LF (GAUZE/BANDAGES/DRESSINGS) ×2 IMPLANT
BNDG ESMARK 6X9 LF (GAUZE/BANDAGES/DRESSINGS) ×3
BNDG GAUZE ELAST 4 BULKY (GAUZE/BANDAGES/DRESSINGS) ×1 IMPLANT
BRUSH SCRUB EZ PLAIN DRY (MISCELLANEOUS) ×4 IMPLANT
CANISTER SUCT 3000ML PPV (MISCELLANEOUS) ×1 IMPLANT
CHLORAPREP W/TINT 26 (MISCELLANEOUS) ×6 IMPLANT
CLOSURE WOUND 1/2 X4 (GAUZE/BANDAGES/DRESSINGS) ×1
COVER SURGICAL LIGHT HANDLE (MISCELLANEOUS) ×3 IMPLANT
COVER WAND RF STERILE (DRAPES) ×1 IMPLANT
CUFF TOURN SGL QUICK 34 (TOURNIQUET CUFF) ×3
CUFF TRNQT CYL 34X4.125X (TOURNIQUET CUFF) ×1 IMPLANT
DRAPE C-ARM 42X72 X-RAY (DRAPES) ×3 IMPLANT
DRAPE C-ARMOR (DRAPES) ×3 IMPLANT
DRAPE ORTHO SPLIT 77X108 STRL (DRAPES) ×6
DRAPE SURG ORHT 6 SPLT 77X108 (DRAPES) ×2 IMPLANT
DRAPE U-SHAPE 47X51 STRL (DRAPES) ×3 IMPLANT
DRILL BIT X LONG 2.5 (BIT) ×3
DRSG PAD ABDOMINAL 8X10 ST (GAUZE/BANDAGES/DRESSINGS) ×2 IMPLANT
ELECT REM PT RETURN 9FT ADLT (ELECTROSURGICAL) ×3
ELECTRODE REM PT RTRN 9FT ADLT (ELECTROSURGICAL) ×1 IMPLANT
GAUZE SPONGE 4X4 12PLY STRL (GAUZE/BANDAGES/DRESSINGS) ×1 IMPLANT
GAUZE SPONGE 4X4 12PLY STRL LF (GAUZE/BANDAGES/DRESSINGS) ×2 IMPLANT
GLOVE BIO SURGEON STRL SZ 6.5 (GLOVE) ×6 IMPLANT
GLOVE BIO SURGEON STRL SZ7.5 (GLOVE) ×12 IMPLANT
GLOVE BIO SURGEONS STRL SZ 6.5 (GLOVE) ×3
GLOVE BIOGEL PI IND STRL 6.5 (GLOVE) ×1 IMPLANT
GLOVE BIOGEL PI IND STRL 7.5 (GLOVE) ×1 IMPLANT
GLOVE BIOGEL PI INDICATOR 6.5 (GLOVE) ×2
GLOVE BIOGEL PI INDICATOR 7.5 (GLOVE) ×2
GOWN STRL REUS W/ TWL LRG LVL3 (GOWN DISPOSABLE) ×2 IMPLANT
GOWN STRL REUS W/TWL LRG LVL3 (GOWN DISPOSABLE) ×9
IMMOBILIZER KNEE 22 UNIV (SOFTGOODS) ×1 IMPLANT
KIT BASIN OR (CUSTOM PROCEDURE TRAY) ×3 IMPLANT
KIT TURNOVER KIT B (KITS) ×3 IMPLANT
NDL SUT 6 .5 CRC .975X.05 MAYO (NEEDLE) ×1 IMPLANT
NEEDLE MAYO TAPER (NEEDLE) ×3
NS IRRIG 1000ML POUR BTL (IV SOLUTION) ×3 IMPLANT
PACK TOTAL JOINT (CUSTOM PROCEDURE TRAY) ×3 IMPLANT
PAD ABD 8X10 STRL (GAUZE/BANDAGES/DRESSINGS) ×2 IMPLANT
PAD ARMBOARD 7.5X6 YLW CONV (MISCELLANEOUS) ×6 IMPLANT
PAD CAST 4YDX4 CTTN HI CHSV (CAST SUPPLIES) ×1 IMPLANT
PADDING CAST COTTON 4X4 STRL (CAST SUPPLIES)
PADDING CAST COTTON 6X4 STRL (CAST SUPPLIES) ×3 IMPLANT
PLATE TIBIA VA-LCP 6H RT (Plate) ×2 IMPLANT
SCREW CORT HEADED ST 3.5X44 (Screw) ×2 IMPLANT
SCREW HEADED ST 3.5X38 (Screw) ×2 IMPLANT
SCREW HEADED ST 3.5X80 (Screw) ×2 IMPLANT
SCREW HEADED ST 3.5X85 (Screw) ×2 IMPLANT
SCREW LOCKING 3.5X80MM VA (Screw) ×2 IMPLANT
SCREW LOCKING VA 3.5X75MM (Screw) ×2 IMPLANT
SCREW LOCKING VA 3.5X85MM (Screw) ×2 IMPLANT
STAPLER VISISTAT 35W (STAPLE) ×1 IMPLANT
STRIP CLOSURE SKIN 1/2X4 (GAUZE/BANDAGES/DRESSINGS) ×1 IMPLANT
SUCTION FRAZIER HANDLE 10FR (MISCELLANEOUS) ×2
SUCTION TUBE FRAZIER 10FR DISP (MISCELLANEOUS) ×1 IMPLANT
SUT ETHILON 2 0 FS 18 (SUTURE) ×1 IMPLANT
SUT ETHILON 3 0 PS 1 (SUTURE) IMPLANT
SUT FIBERWIRE #2 38 T-5 BLUE (SUTURE) ×3
SUT MNCRL AB 3-0 PS2 18 (SUTURE) ×2 IMPLANT
SUT VIC AB 0 CT1 27 (SUTURE)
SUT VIC AB 0 CT1 27XBRD ANBCTR (SUTURE) IMPLANT
SUT VIC AB 1 CT1 18XCR BRD 8 (SUTURE) IMPLANT
SUT VIC AB 1 CT1 27 (SUTURE) ×3
SUT VIC AB 1 CT1 27XBRD ANBCTR (SUTURE) ×1 IMPLANT
SUT VIC AB 1 CT1 8-18 (SUTURE) ×3
SUT VIC AB 2-0 CT1 27 (SUTURE) ×3
SUT VIC AB 2-0 CT1 TAPERPNT 27 (SUTURE) ×2 IMPLANT
SUTURE FIBERWR #2 38 T-5 BLUE (SUTURE) IMPLANT
TOWEL GREEN STERILE (TOWEL DISPOSABLE) ×6 IMPLANT
TRAY FOLEY MTR SLVR 16FR STAT (SET/KITS/TRAYS/PACK) IMPLANT
WATER STERILE IRR 1000ML POUR (IV SOLUTION) ×4 IMPLANT

## 2018-11-07 NOTE — Care Management (Signed)
RW ordered for delivery to room prior to d/c.  
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
## 2018-11-07 NOTE — Anesthesia Postprocedure Evaluation (Signed)
Anesthesia Post Note  Patient: Bobby [name]  Procedure(s) Performed: OPEN REDUCTION INTERNAL FIXATION (ORIF) TIBIAL PLATEAU (Right )     Patient location during evaluation: PACU Anesthesia Type: General Level of consciousness: awake and alert Pain management: pain level controlled Vital Signs Assessment: post-procedure vital signs reviewed and stable Respiratory status: spontaneous breathing, nonlabored ventilation, respiratory function stable and patient connected to nasal cannula oxygen Cardiovascular status: blood pressure returned to baseline and stable Postop Assessment: no apparent nausea or vomiting Anesthetic complications: no    Last Vitals:  Vitals:   11/07/18 1332 11/07/18 1350  BP:  107/60  Pulse:  75  Resp:  18  Temp: 36.6 C 36.5 C  SpO2:      Last Pain:  Vitals:   11/07/18 1350  TempSrc: Oral  PainSc:                  Takeia Ciaravino L Sueko Dimichele

## 2018-11-07 NOTE — Progress Notes (Signed)
Orthopedic Tech Progress Note Patient Details:  Bobby [name] 1976/03/03 [id number]  Patient ID: Bobby [name], male   DOB: November 27, 1975, 43 y.o.   MRN: [id number]   [name] 11/07/2018, 12:14 PMCalled [employer] for right Hinged knee brace.

## 2018-11-07 NOTE — Discharge Instructions (Signed)
Orthopaedic Trauma Service Discharge Instructions   General Discharge Instructions  WEIGHT BEARING STATUS:Non-weightbearing on right leg  RANGE OF MOTION/ACTIVITY: Okay for gentle knee range of motion in brace during day. Lock brace in extension at night  Wound Care: Okay to remove dressing on post op day #2 (11/09/18). Leave steri-strips in place, they will fall off on their own. Incisions can be left open to air if there is no drainage. If incision continues to have drainage, follow wound care instructions below. Okay to shower and get incisions wet  DVT/PE prophylaxis: Aspirin 325 mg daily  Diet: as you were eating previously.  Can use over the counter stool softeners and bowel preparations, such as Miralax, to help with bowel movements.  Narcotics can be constipating.  Be sure to drink plenty of fluids  PAIN MEDICATION USE AND EXPECTATIONS  You have likely been given narcotic medications to help control your pain.  After a traumatic event that results in an fracture (broken bone) with or without surgery, it is ok to use narcotic pain medications to help control one's pain.  We understand that everyone responds to pain differently and each individual patient will be evaluated on a regular basis for the continued need for narcotic medications. Ideally, narcotic medication use should last no more than 6-8 weeks (coinciding with fracture healing).   As a patient it is your responsibility as well to monitor narcotic medication use and report the amount and frequency you use these medications when you come to your office visit.   We would also advise that if you are using narcotic medications, you should take a dose prior to therapy to maximize you participation.  IF YOU ARE ON NARCOTIC MEDICATIONS IT IS NOT PERMISSIBLE TO OPERATE A MOTOR VEHICLE (MOTORCYCLE/CAR/TRUCK/MOPED) OR HEAVY MACHINERY DO NOT MIX NARCOTICS WITH OTHER CNS (CENTRAL NERVOUS SYSTEM) DEPRESSANTS SUCH AS ALCOHOL   STOP  SMOKING OR USING NICOTINE PRODUCTS!!!!  As discussed nicotine severely impairs your body's ability to heal surgical and traumatic wounds but also impairs bone healing.  Wounds and bone heal by forming microscopic blood vessels (angiogenesis) and nicotine is a vasoconstrictor (essentially, shrinks blood vessels).  Therefore, if vasoconstriction occurs to these microscopic blood vessels they essentially disappear and are unable to deliver necessary nutrients to the healing tissue.  This is one modifiable factor that you can do to dramatically increase your chances of healing your injury.    (This means no smoking, no nicotine gum, patches, etc)  DO NOT USE NONSTEROIDAL ANTI-INFLAMMATORY DRUGS (NSAID'S)  Using products such as Advil (ibuprofen), Aleve (naproxen), Motrin (ibuprofen) for additional pain control during fracture healing can delay and/or prevent the healing response.  If you would like to take over the counter (OTC) medication, Tylenol (acetaminophen) is ok.  However, some narcotic medications that are given for pain control contain acetaminophen as well. Therefore, you should not exceed more than 4000 mg of tylenol in a day if you do not have liver disease.  Also note that there are may OTC medicines, such as cold medicines and allergy medicines that my contain tylenol as well.  If you have any questions about medications and/or interactions please ask your doctor/PA or your pharmacist.      ICE AND ELEVATE INJURED/OPERATIVE EXTREMITY  Using ice and elevating the injured extremity above your heart can help with swelling and pain control.  Icing in a pulsatile fashion, such as 20 minutes on and 20 minutes off, can be followed.    Do not  place ice directly on skin. Make sure there is a barrier between to skin and the ice pack.    Using frozen items such as frozen peas works well as the conform nicely to the are that needs to be iced.  USE AN ACE WRAP OR TED HOSE FOR SWELLING CONTROL  In  addition to icing and elevation, Ace wraps or TED hose are used to help limit and resolve swelling.  It is recommended to use Ace wraps or TED hose until you are informed to stop.    When using Ace Wraps start the wrapping distally (farthest away from the body) and wrap proximally (closer to the body)   Example: If you had surgery on your leg or thing and you do not have a splint on, start the ace wrap at the toes and work your way up to the thigh        If you had surgery on your upper extremity and do not have a splint on, start the ace wrap at your fingers and work your way up to the upper arm   CALL THE OFFICE WITH ANY QUESTIONS OR CONCERNS: [phone]   VISIT OUR WEBSITE FOR ADDITIONAL INFORMATION: [email or web address]    Discharge Wound Care Instructions  Do NOT apply any ointments, solutions or lotions to pin sites or surgical wounds.  These prevent needed drainage and even though solutions like hydrogen peroxide kill bacteria, they also damage cells lining the pin sites that help fight infection.  Applying lotions or ointments can keep the wounds moist and can cause them to breakdown and open up as well. This can increase the risk for infection. When in doubt call the office.  Surgical incisions should be dressed daily.  If any drainage is noted, use one layer of adaptic, then gauze, Kerlix, and an ace wrap.  Once the incision is completely dry and without drainage, it may be left open to air out.  Showering may begin 36-48 hours later.  Cleaning gently with soap and water.  Traumatic wounds should be dressed daily as well.    One layer of adaptic, gauze, Kerlix, then ace wrap.  The adaptic can be discontinued once the draining has ceased    If you have a wet to dry dressing: wet the gauze with saline the squeeze as much saline out so the gauze is moist (not soaking wet), place moistened gauze over wound, then place a dry gauze over the moist one, followed by Kerlix wrap, then ace  wrap.

## 2018-11-07 NOTE — Anesthesia Preprocedure Evaluation (Addendum)
Anesthesia Evaluation  Patient identified by MRN, date of birth, ID band Patient awake    Reviewed: Allergy & Precautions, NPO status , Patient's Chart, lab work & pertinent test results  Airway Mallampati: III  TM Distance: >3 FB Neck ROM: Full    Dental  (+) Teeth Intact, Dental Advisory Given   Pulmonary neg pulmonary ROS,    breath sounds clear to auscultation       Cardiovascular negative cardio ROS   Rhythm:Regular Rate:Normal     Neuro/Psych negative neurological ROS     GI/Hepatic negative GI ROS, Neg liver ROS,   Endo/Other  negative endocrine ROS  Renal/GU negative Renal ROS     Musculoskeletal negative musculoskeletal ROS (+)   Abdominal (+) + obese,   Peds  Hematology negative hematology ROS (+)   Anesthesia Other Findings   Reproductive/Obstetrics                            Anesthesia Physical Anesthesia Plan  ASA: II  Anesthesia Plan: General   Post-op Pain Management:    Induction: Intravenous  PONV Risk Score and Plan: 3 and Ondansetron, Dexamethasone and Midazolam  Airway Management Planned: Oral ETT  Additional Equipment: None  Intra-op Plan:   Post-operative Plan: Extubation in OR  Informed Consent: I have reviewed the patients History and Physical, chart, labs and discussed the procedure including the risks, benefits and alternatives for the proposed anesthesia with the patient or authorized representative who has indicated his/her understanding and acceptance.     Dental advisory given  Plan Discussed with: CRNA  Anesthesia Plan Comments:        Anesthesia Quick Evaluation
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
## 2018-11-07 NOTE — Transfer of Care (Signed)
Immediate Anesthesia Transfer of Care Note  Patient: Bobby [name]  Procedure(s) Performed: OPEN REDUCTION INTERNAL FIXATION (ORIF) TIBIAL PLATEAU (Right )  Patient Location: PACU  Anesthesia Type:General  Level of Consciousness: drowsy and patient cooperative  Airway & Oxygen Therapy: Patient Spontanous Breathing and Patient connected to face mask oxygen  Post-op Assessment: Report given to RN and Post -op Vital signs reviewed and stable  Post vital signs: Reviewed and stable  Last Vitals:  Vitals Value Taken Time  BP 127/63 11/07/18 1200  Temp 36.6 C 11/07/18 1200  Pulse 88 11/07/18 1201  Resp 13 11/07/18 1201  SpO2 94 % 11/07/18 1201  Vitals shown include unvalidated device data.  Last Pain:  Vitals:   11/07/18 1200  TempSrc:   PainSc: Asleep      Patients Stated Pain Goal: 0 (15/94/58 5929)  Complications: No apparent anesthesia complications

## 2018-11-07 NOTE — Anesthesia Procedure Notes (Signed)
Procedure Name: Intubation Date/Time: 11/07/2018 9:41 AM Performed by: Elayne Snare, CRNA Pre-anesthesia Checklist: Patient identified, Emergency Drugs available, Suction available and Patient being monitored Patient Re-evaluated:Patient Re-evaluated prior to induction Oxygen Delivery Method: Circle System Utilized Preoxygenation: Pre-oxygenation with 100% oxygen Induction Type: IV induction and Rapid sequence Laryngoscope Size: Mac and 4 Grade View: Grade I Tube type: Oral Tube size: 7.5 mm Number of attempts: 1 Airway Equipment and Method: Stylet Placement Confirmation: ETT inserted through vocal cords under direct vision,  positive ETCO2 and breath sounds checked- equal and bilateral Secured at: 24 cm Tube secured with: Tape Dental Injury: Teeth and Oropharynx as per pre-operative assessment
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
## 2018-11-07 NOTE — Progress Notes (Signed)
Pt given discharge instructions and gone over with him. Pt and wife verbalized understanding of instructions. Walker delivered to room. All belongings gathered for discharge. Pt adamant about going home today.

## 2018-11-10 ENCOUNTER — Encounter (HOSPITAL_COMMUNITY): Payer: Self-pay | Admitting: Student

## 2018-11-12 ENCOUNTER — Encounter (HOSPITAL_COMMUNITY): Payer: Self-pay | Admitting: General Surgery

## 2018-11-14 ENCOUNTER — Other Ambulatory Visit: Payer: Self-pay

## 2018-11-14 ENCOUNTER — Encounter (HOSPITAL_COMMUNITY): Payer: Self-pay | Admitting: Emergency Medicine

## 2018-11-14 ENCOUNTER — Ambulatory Visit (HOSPITAL_COMMUNITY): Admission: RE | Admit: 2018-11-14 | Payer: Self-pay | Source: Ambulatory Visit

## 2018-11-14 ENCOUNTER — Emergency Department (HOSPITAL_COMMUNITY): Payer: BC Managed Care – PPO

## 2018-11-14 ENCOUNTER — Inpatient Hospital Stay (HOSPITAL_COMMUNITY)
Admission: EM | Admit: 2018-11-14 | Discharge: 2018-11-15 | DRG: 176 | Disposition: A | Discharge status: Home / Self Care | Payer: BC Managed Care – PPO | Attending: Family Medicine | Admitting: Family Medicine

## 2018-11-14 ENCOUNTER — Other Ambulatory Visit: Payer: Self-pay | Admitting: Student

## 2018-11-14 DIAGNOSIS — I2694 Multiple subsegmental pulmonary emboli without acute cor pulmonale: Secondary | ICD-10-CM | POA: Diagnosis not present

## 2018-11-14 DIAGNOSIS — F419 Anxiety disorder, unspecified: Secondary | ICD-10-CM | POA: Diagnosis present

## 2018-11-14 DIAGNOSIS — R0602 Shortness of breath: Secondary | ICD-10-CM | POA: Diagnosis not present

## 2018-11-14 DIAGNOSIS — Z791 Long term (current) use of non-steroidal anti-inflammatories (NSAID): Secondary | ICD-10-CM

## 2018-11-14 DIAGNOSIS — Z9049 Acquired absence of other specified parts of digestive tract: Secondary | ICD-10-CM

## 2018-11-14 DIAGNOSIS — S83289D Other tear of lateral meniscus, current injury, unspecified knee, subsequent encounter: Secondary | ICD-10-CM

## 2018-11-14 DIAGNOSIS — Z79899 Other long term (current) drug therapy: Secondary | ICD-10-CM | POA: Diagnosis not present

## 2018-11-14 DIAGNOSIS — I824Z9 Acute embolism and thrombosis of unspecified deep veins of unspecified distal lower extremity: Secondary | ICD-10-CM | POA: Diagnosis not present

## 2018-11-14 DIAGNOSIS — M79661 Pain in right lower leg: Secondary | ICD-10-CM | POA: Diagnosis not present

## 2018-11-14 DIAGNOSIS — I82451 Acute embolism and thrombosis of right peroneal vein: Secondary | ICD-10-CM

## 2018-11-14 DIAGNOSIS — Z79891 Long term (current) use of opiate analgesic: Secondary | ICD-10-CM

## 2018-11-14 DIAGNOSIS — Z20828 Contact with and (suspected) exposure to other viral communicable diseases: Secondary | ICD-10-CM | POA: Diagnosis present

## 2018-11-14 DIAGNOSIS — Z880 Allergy status to penicillin: Secondary | ICD-10-CM

## 2018-11-14 DIAGNOSIS — K219 Gastro-esophageal reflux disease without esophagitis: Secondary | ICD-10-CM | POA: Diagnosis not present

## 2018-11-14 DIAGNOSIS — S82141D Displaced bicondylar fracture of right tibia, subsequent encounter for closed fracture with routine healing: Secondary | ICD-10-CM | POA: Diagnosis not present

## 2018-11-14 DIAGNOSIS — Z87891 Personal history of nicotine dependence: Secondary | ICD-10-CM | POA: Diagnosis not present

## 2018-11-14 DIAGNOSIS — Z7982 Long term (current) use of aspirin: Secondary | ICD-10-CM | POA: Diagnosis not present

## 2018-11-14 DIAGNOSIS — M79604 Pain in right leg: Secondary | ICD-10-CM | POA: Diagnosis not present

## 2018-11-14 DIAGNOSIS — S82142A Displaced bicondylar fracture of left tibia, initial encounter for closed fracture: Secondary | ICD-10-CM

## 2018-11-14 DIAGNOSIS — I2699 Other pulmonary embolism without acute cor pulmonale: Secondary | ICD-10-CM

## 2018-11-14 DIAGNOSIS — Z03818 Encounter for observation for suspected exposure to other biological agents ruled out: Secondary | ICD-10-CM | POA: Diagnosis not present

## 2018-11-14 DIAGNOSIS — I82401 Acute embolism and thrombosis of unspecified deep veins of right lower extremity: Secondary | ICD-10-CM | POA: Diagnosis not present

## 2018-11-14 DIAGNOSIS — I82409 Acute embolism and thrombosis of unspecified deep veins of unspecified lower extremity: Secondary | ICD-10-CM | POA: Diagnosis present

## 2018-11-14 HISTORY — DX: Other pulmonary embolism without acute cor pulmonale: I26.99

## 2018-11-14 LAB — CBC
HCT: 44.4 % (ref 39.0–52.0)
Hemoglobin: 15.4 g/dL (ref 13.0–17.0)
MCH: 30.4 pg (ref 26.0–34.0)
MCHC: 34.7 g/dL (ref 30.0–36.0)
MCV: 87.7 fL (ref 80.0–100.0)
Platelets: 341 10*3/uL (ref 150–400)
RBC: 5.06 MIL/uL (ref 4.22–5.81)
RDW: 13 % (ref 11.5–15.5)
WBC: 8.1 10*3/uL (ref 4.0–10.5)
nRBC: 0 % (ref 0.0–0.2)

## 2018-11-14 LAB — TROPONIN I (HIGH SENSITIVITY)
Troponin I (High Sensitivity): 5 ng/L (ref <18)
Troponin I (High Sensitivity): 5 ng/L (ref <18)

## 2018-11-14 LAB — BASIC METABOLIC PANEL
Anion gap: 10 (ref 5–15)
BUN: 27 mg/dL — ABNORMAL HIGH (ref 6–20)
CO2: 25 mmol/L (ref 22–32)
Calcium: 9.2 mg/dL (ref 8.9–10.3)
Chloride: 102 mmol/L (ref 98–111)
Creatinine, Ser: 1.32 mg/dL — ABNORMAL HIGH (ref 0.61–1.24)
GFR calc Af Amer: >60 mL/min (ref >60)
GFR calc non Af Amer: >60 mL/min (ref >60)
Glucose, Bld: 84 mg/dL (ref 70–99)
Potassium: 4 mmol/L (ref 3.5–5.1)
Sodium: 137 mmol/L (ref 135–145)

## 2018-11-14 LAB — SARS CORONAVIRUS 2 BY RT PCR (HOSPITAL ORDER, PERFORMED IN ~~LOC~~ HOSPITAL LAB): SARS Coronavirus 2: NEGATIVE

## 2018-11-14 LAB — HEPARIN LEVEL (UNFRACTIONATED): Heparin Unfractionated: 0.75 IU/mL — ABNORMAL HIGH (ref 0.30–0.70)

## 2018-11-14 MED ORDER — HEPARIN BOLUS VIA INFUSION
5000.0000 [IU] | Freq: Once | INTRAVENOUS | Status: AC
  Administered 2018-11-14: 16:00:00 5000 [IU] via INTRAVENOUS

## 2018-11-14 MED ORDER — ACETAMINOPHEN 325 MG PO TABS
650.0000 mg | ORAL_TABLET | Freq: Four times a day (QID) | ORAL | Status: DC | PRN
  Administered 2018-11-15: 650 mg via ORAL
  Filled 2018-11-14: qty 2

## 2018-11-14 MED ORDER — IBUPROFEN 800 MG PO TABS
800.0000 mg | ORAL_TABLET | Freq: Once | ORAL | Status: AC
  Administered 2018-11-14: 800 mg via ORAL
  Filled 2018-11-14: qty 1

## 2018-11-14 MED ORDER — ONDANSETRON HCL 4 MG PO TABS: 4.0000 mg | ORAL_TABLET | Freq: Four times a day (QID) | ORAL | Status: DC | PRN

## 2018-11-14 MED ORDER — ALPRAZOLAM 0.5 MG PO TABS
0.5000 mg | ORAL_TABLET | Freq: Once | ORAL | Status: AC
  Administered 2018-11-14: 0.5 mg via ORAL
  Filled 2018-11-14: qty 1

## 2018-11-14 MED ORDER — ACETAMINOPHEN 650 MG RE SUPP: 650.0000 mg | Freq: Four times a day (QID) | RECTAL | Status: DC | PRN

## 2018-11-14 MED ORDER — HEPARIN (PORCINE) 25000 UT/250ML-% IV SOLN
1500.0000 [IU]/h | INTRAVENOUS | Status: DC
  Administered 2018-11-14: 16:00:00 1650 [IU]/h via INTRAVENOUS
  Administered 2018-11-15: 1500 [IU]/h via INTRAVENOUS
  Filled 2018-11-14 (×2): qty 250

## 2018-11-14 MED ORDER — ONDANSETRON HCL 4 MG/2ML IJ SOLN: 4.0000 mg | Freq: Four times a day (QID) | INTRAMUSCULAR | Status: DC | PRN

## 2018-11-14 MED ORDER — IOHEXOL 350 MG/ML SOLN
75.0000 mL | Freq: Once | INTRAVENOUS | Status: AC | PRN
  Administered 2018-11-14: 15:00:00 75 mL via INTRAVENOUS

## 2018-11-14 MED ORDER — SODIUM CHLORIDE 0.9 % IV SOLN
INTRAVENOUS | Status: AC
  Administered 2018-11-14 – 2018-11-15 (×2): via INTRAVENOUS

## 2018-11-14 MED ORDER — POLYETHYLENE GLYCOL 3350 17 G PO PACK: 17.0000 g | PACK | Freq: Every day | ORAL | Status: DC | PRN
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
## 2018-11-14 NOTE — Progress Notes (Signed)
Pulmonology Note  Case, imaging, labs, vitals reviewed.  Provoked PE, perhaps submassive by CT criteria but not very impressive. I see no high risk features that warrant EKOS evaluation.  PESI score 80, class II low risk.  Should he develop worsening O2 requirement or dizziness or if his echo is concerning we can certainly re-evaluate but from available information I would treat with AC alone- eliquis or similar x 3 months.  Please call if we can be of further assistance.  Erskine Emery MD

## 2018-11-14 NOTE — Addendum Note (Signed)
Addended by: Shona Needles on: 11/14/2018 11:48 AM   Modules accepted: Orders
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
## 2018-11-14 NOTE — ED Notes (Signed)
Patient transported to CT 

## 2018-11-15 ENCOUNTER — Inpatient Hospital Stay (HOSPITAL_COMMUNITY): Payer: BC Managed Care – PPO

## 2018-11-15 DIAGNOSIS — I2699 Other pulmonary embolism without acute cor pulmonale: Secondary | ICD-10-CM

## 2018-11-15 LAB — ECHOCARDIOGRAM COMPLETE
Height: 73 in
Weight: 3880.1 oz

## 2018-11-15 LAB — CBC
HCT: 38.3 % — ABNORMAL LOW (ref 39.0–52.0)
Hemoglobin: 13.3 g/dL (ref 13.0–17.0)
MCH: 30.6 pg (ref 26.0–34.0)
MCHC: 34.7 g/dL (ref 30.0–36.0)
MCV: 88 fL (ref 80.0–100.0)
Platelets: 286 10*3/uL (ref 150–400)
RBC: 4.35 MIL/uL (ref 4.22–5.81)
RDW: 12.7 % (ref 11.5–15.5)
WBC: 8.7 10*3/uL (ref 4.0–10.5)
nRBC: 0 % (ref 0.0–0.2)

## 2018-11-15 LAB — BASIC METABOLIC PANEL
Anion gap: 9 (ref 5–15)
BUN: 24 mg/dL — ABNORMAL HIGH (ref 6–20)
CO2: 23 mmol/L (ref 22–32)
Calcium: 8.6 mg/dL — ABNORMAL LOW (ref 8.9–10.3)
Chloride: 103 mmol/L (ref 98–111)
Creatinine, Ser: 1.16 mg/dL (ref 0.61–1.24)
GFR calc Af Amer: >60 mL/min (ref >60)
GFR calc non Af Amer: >60 mL/min (ref >60)
Glucose, Bld: 109 mg/dL — ABNORMAL HIGH (ref 70–99)
Potassium: 4.6 mmol/L (ref 3.5–5.1)
Sodium: 135 mmol/L (ref 135–145)

## 2018-11-15 LAB — HEPARIN LEVEL (UNFRACTIONATED): Heparin Unfractionated: 0.54 IU/mL (ref 0.30–0.70)

## 2018-11-15 MED ORDER — APIXABAN 5 MG PO TABS
10.0000 mg | ORAL_TABLET | Freq: Two times a day (BID) | ORAL | Status: DC
  Administered 2018-11-15: 10 mg via ORAL
  Filled 2018-11-15: qty 2

## 2018-11-15 MED ORDER — APIXABAN 5 MG PO TABS: 5.0000 mg | ORAL_TABLET | Freq: Two times a day (BID) | ORAL | Status: DC

## 2018-11-15 MED ORDER — ELIQUIS 5 MG VTE STARTER PACK: ORAL_TABLET | ORAL | 0 refills | Status: DC
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
## 2018-11-15 NOTE — Progress Notes (Signed)
ANTICOAGULATION CONSULT NOTE - Follow Up Consult  Pharmacy Consult for heparin Indication: PE/DVT  Labs: Recent Labs    11/14/18 1436 11/14/18 1539 11/14/18 1745 11/14/18 2149  HGB 15.4  --   --   --   HCT 44.4  --   --   --   PLT 341  --   --   --   HEPARINUNFRC  --   --   --  0.75*  CREATININE 1.32*  --   --   --   TROPONINIHS  --  5 5  --     Assessment: 42yo male supratherapeutic on heparin with initial dosing for PE/DVT.  Goal of Therapy:  Heparin level 0.3-0.7 units/ml   Plan:  Will decrease heparin gtt by 1-2 units/kg/hr to 1500 units/hr and check level in 6 hours; consider transitioning to LMWH.  Wynona Neat, PharmD, BCPS  11/15/2018,12:19 AM
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
## 2018-11-15 NOTE — Progress Notes (Signed)
Discharge instructions given to patient. Patient verbalized understanding. Eliquis 30 day information given to patient. Patient verbalized understanding. One peripheral IV removed from patient. Patient waiting for wife to come pick him up.

## 2018-11-15 NOTE — Discharge Instructions (Signed)
Instructions for apixaban (Eliquis):   Take 10 mg twice daily through 11/21/18 then take 5 mg twice daily for the remainder of 3 months.   You should take eliquis for a total of 3 months.    Bleeding Precautions When on Anticoagulant Therapy, Adult Anticoagulant therapy, also called blood thinner therapy, is medicine that helps to prevent and treat blood clots. The medicine works by stopping blood clots from forming or growing. Blood clots that form in your blood vessels can be dangerous. They can break loose and travel to the heart, lungs, or brain. This increases the risk of a heart attack, stroke, or blocked lung artery (pulmonary embolism). Anticoagulants also increase the risk of bleeding. Try to protect yourself from cuts and other injuries that can cause bleeding. It is important to take anticoagulants exactly as told by your health care provider. Why do I need to be on anticoagulant therapy? You may need this medicine if you are at risk of developing a blood clot. Conditions that increase your risk of a blood clot include:  Being born with heart disease or a heart malformation (congenital heart disease).  Developing heart disease.  Having had surgery, such as valve replacement.  Having had a serious accident or other type of severe injury (trauma).  Having certain types of cancer.  Having certain diseases that can increase blood clotting.  Having a high risk of stroke or heart attack.  Having atrial fibrillation (AF). What are the common anticoagulant medicines? There are several types of anticoagulant medicines. The most common types are:  Medicines that you take by mouth (oral medicines), such as: ? Warfarin. ? Novel oral anticoagulants (NOACs), such as: ? Direct thrombin inhibitors (dabigatran). ? Factor Xa inhibitors (apixaban, edoxaban, and rivaroxaban).  Injections, such as: ? Unfractionated heparin. ? Low molecular weight heparin. These anticoagulants work in  different ways to prevent blood clots. They also have different risks and side effects. What do I need to remember while on anticoagulant therapy? Taking anticoagulants  Take your medicine at the same time every day. If you forget to take your medicine, take it as soon as you remember. Do not double your dosage of medicine if you miss a whole day. Take your normal dose and call your health care provider.  Do not stop taking your medicine unless your health care provider approves. Stopping the medicine can increase your risk of developing a blood clot. Taking other medicines  Take over-the-counter and prescriptions medicines only as told by your health care provider.  Do not take over-the-counter NSAIDs, including aspirin and ibuprofen, while you are on anticoagulant therapy. These medicines increase your risk of dangerous bleeding.  Get approval from your health care provider before you start taking any new medicines, vitamins, or herbal products. Some of these could interfere with your therapy. General instructions  Keep all follow-up visits as told by your health care provider. This is important.  If you are pregnant or trying to get pregnant, talk with a health care provider about anticoagulants. Some of these medicines are not safe to take during pregnancy.  Tell all health care providers, including your dentist, that you are on anticoagulant therapy. It is especially important to tell providers before you have any surgery, medical procedures, or dental work done. What precautions should I take?   Be very careful when using knives, scissors, or other sharp objects.  Use an electric razor instead of a blade.  Do not use toothpicks.  Use a soft-bristled toothbrush. Brush  your teeth gently.  Always wear shoes outdoors and wear slippers indoors.  Be careful when cutting your fingernails and toenails.  Place bath mats in the bathroom. If possible, install handrails as well.  Wear  gloves while you do yard work.  Wear your seat belt.  Prevent falls by removing loose rugs and extension cords from areas where you walk. Use a cane or walker if you need it.  Avoid constipation by: ? Drinking enough fluid to keep your urine clear or pale yellow. ? Eating foods that are high in fiber, such as fresh fruits and vegetables, whole grains, and beans. ? Limiting foods that are high in fat and processed sugars, such as fried and sweet foods.  Do not play contact sports or participate in other activities that have a high risk for injury. What other precautions are important if on warfarin therapy? If you are taking a type of anticoagulant called warfarin, make sure you:  Work with a diet and nutrition specialist (dietitian) to make an eating plan. Do not make any sudden changes to your diet after you have started your eating plan.  Do not drink alcohol. It can interfere with your medicine and increase your risk of an injury that causes bleeding.  Get regular blood tests as told by your health care provider. What are some questions to ask my health care provider?  Why do I need anticoagulant therapy?  What is the best anticoagulant therapy for my condition?  How long will I need anticoagulant therapy?  What are the side effects of anticoagulant therapy?  When should I take my medicine? What should I do if I forget to take it?  Will I need to have regular blood tests?  Do I need to change my diet? Are there foods or drinks that I should avoid?  What activities are safe for me?  What should I do if I want to get pregnant? Contact a health care provider if:  You miss a dose of medicine: ? And you are not sure what to do. ? For more than one day.  You have: ? Menstrual bleeding that is heavier than normal. ? Bloody or brown urine. ? Easy bruising. ? Black and tarry stool or bright red stool. ? Side effects from your medicine.  You feel weak or dizzy.  You  become pregnant. Get help right away if:  You have bleeding that will not stop within 20 minutes from: ? The nose. ? The gums. ? A cut on the skin.  You have a severe headache or stomachache.  You vomit or cough up blood.  You fall or hit your head. Summary  Anticoagulant therapy, also called blood thinner therapy, is medicine that helps to prevent and treat blood clots.  Anticoagulants work in different ways to prevent blood clots. They also have different risks and side effects.  Talk with your health care provider about any precautions that you should take while on anticoagulant therapy. This information is not intended to replace advice given to you by your health care provider. Make sure you discuss any questions you have with your health care provider. Document Released: 03/21/2015 Document Revised: 07/30/2018 Document Reviewed: 06/26/2016 Elsevier Patient Education  2020 Elsevier Inc.   Apixaban oral tablets What is this medicine? APIXABAN (a PIX a ban) is an anticoagulant (blood thinner). It is used to lower the chance of stroke in people with a medical condition called atrial fibrillation. It is also used to treat or prevent  blood clots in the lungs or in the veins. This medicine may be used for other purposes; ask your health care provider or pharmacist if you have questions. COMMON BRAND NAME(S): Eliquis What should I tell my health care provider before I take this medicine? They need to know if you have any of these conditions:  antiphospholipid antibody syndrome  bleeding disorders  bleeding in the brain  blood in your stools (black or tarry stools) or if you have blood in your vomit  history of blood clots  history of stomach bleeding  kidney disease  liver disease  mechanical heart valve  an unusual or allergic reaction to apixaban, other medicines, foods, dyes, or preservatives  pregnant or trying to get pregnant  breast-feeding How should I  use this medicine? Take this medicine by mouth with a glass of water. Follow the directions on the prescription label. You can take it with or without food. If it upsets your stomach, take it with food. Take your medicine at regular intervals. Do not take it more often than directed. Do not stop taking except on your doctor's advice. Stopping this medicine may increase your risk of a blood clot. Be sure to refill your prescription before you run out of medicine. Talk to your pediatrician regarding the use of this medicine in children. Special care may be needed. Overdosage: If you think you have taken too much of this medicine contact a poison control center or emergency room at once. NOTE: This medicine is only for you. Do not share this medicine with others. What if I miss a dose? If you miss a dose, take it as soon as you can. If it is almost time for your next dose, take only that dose. Do not take double or extra doses. What may interact with this medicine? This medicine may interact with the following:  aspirin and aspirin-like medicines  certain medicines for fungal infections like ketoconazole and itraconazole  certain medicines for seizures like carbamazepine and phenytoin  certain medicines that treat or prevent blood clots like warfarin, enoxaparin, and dalteparin  clarithromycin  NSAIDs, medicines for pain and inflammation, like ibuprofen or naproxen  rifampin  ritonavir  St. John's wort This list may not describe all possible interactions. Give your health care provider a list of all the medicines, herbs, non-prescription drugs, or dietary supplements you use. Also tell them if you smoke, drink alcohol, or use illegal drugs. Some items may interact with your medicine. What should I watch for while using this medicine? Visit your healthcare professional for regular checks on your progress. You may need blood work done while you are taking this medicine. Your condition will be  monitored carefully while you are receiving this medicine. It is important not to miss any appointments. Avoid sports and activities that might cause injury while you are using this medicine. Severe falls or injuries can cause unseen bleeding. Be careful when using sharp tools or knives. Consider using an Neurosurgeonelectric razor. Take special care brushing or flossing your teeth. Report any injuries, bruising, or red spots on the skin to your healthcare professional. If you are going to need surgery or other procedure, tell your healthcare professional that you are taking this medicine. Wear a medical ID bracelet or chain. Carry a card that describes your disease and details of your medicine and dosage times. What side effects may I notice from receiving this medicine? Side effects that you should report to your doctor or health care professional as soon  as possible:  allergic reactions like skin rash, itching or hives, swelling of the face, lips, or tongue  signs and symptoms of bleeding such as bloody or black, tarry stools; red or dark-brown urine; spitting up blood or brown material that looks like coffee grounds; red spots on the skin; unusual bruising or bleeding from the eye, gums, or nose  signs and symptoms of a blood clot such as chest pain; shortness of breath; pain, swelling, or warmth in the leg  signs and symptoms of a stroke such as changes in vision; confusion; trouble speaking or understanding; severe headaches; sudden numbness or weakness of the face, arm or leg; trouble walking; dizziness; loss of coordination This list may not describe all possible side effects. Call your doctor for medical advice about side effects. You may report side effects to FDA at 1-800-FDA-1088. Where should I keep my medicine? Keep out of the reach of children. Store at room temperature between 20 and 25 degrees C (68 and 77 degrees F). Throw away any unused medicine after the expiration date. NOTE: This sheet  is a summary. It may not cover all possible information. If you have questions about this medicine, talk to your doctor, pharmacist, or health care provider.  2020 Elsevier/Gold Standard (2017-12-18 17:39:34)  IMPORTANT INFORMATION: PAY CLOSE ATTENTION   PHYSICIAN DISCHARGE INSTRUCTIONS  Follow with Primary care provider  Gareth MorganKnowlton, Steve, MD  and other consultants as instructed by your Hospitalist Physician  SEEK MEDICAL CARE OR RETURN TO EMERGENCY ROOM IF SYMPTOMS COME BACK, WORSEN OR NEW PROBLEM DEVELOPS   Please note: You were cared for by a hospitalist during your hospital stay. Every effort will be made to forward records to your primary care provider.  You can request that your primary care provider send for your hospital records if they have not received them.  Once you are discharged, your primary care physician will handle any further medical issues. Please note that NO REFILLS for any discharge medications will be authorized once you are discharged, as it is imperative that you return to your primary care physician (or establish a relationship with a primary care physician if you do not have one) for your post hospital discharge needs so that they can reassess your need for medications and monitor your lab values.  Please get a complete blood count and chemistry panel checked by your Primary MD at your next visit, and again as instructed by your Primary MD.  Get Medicines reviewed and adjusted: Please take all your medications with you for your next visit with your Primary MD  Laboratory/radiological data: Please request your Primary MD to go over all hospital tests and procedure/radiological results at the follow up, please ask your primary care provider to get all Hospital records sent to his/her office.  In some cases, they will be blood work, cultures and biopsy results pending at the time of your discharge. Please request that your primary care provider follow up on these  results.  If you are diabetic, please bring your blood sugar readings with you to your follow up appointment with primary care.    Please call and make your follow up appointments as soon as possible.    Also Note the following: If you experience worsening of your admission symptoms, develop shortness of breath, life threatening emergency, suicidal or homicidal thoughts you must seek medical attention immediately by calling 911 or calling your MD immediately  if symptoms less severe.  You must read complete instructions/literature along with all  the possible adverse reactions/side effects for all the Medicines you take and that have been prescribed to you. Take any new Medicines after you have completely understood and accpet all the possible adverse reactions/side effects.   Do not drive when taking Pain medications or sleeping medications (Benzodiazepines)  Do not take more than prescribed Pain, Sleep and Anxiety Medications. It is not advisable to combine anxiety,sleep and pain medications without talking with your primary care practitioner  Special Instructions: If you have smoked or chewed Tobacco  in the last 2 yrs please stop smoking, stop any regular Alcohol  and or any Recreational drug use.  Wear Seat belts while driving.  Do not drive if taking any narcotic, mind altering or controlled substances or recreational drugs or alcohol.

## 2018-11-15 NOTE — Progress Notes (Signed)
*  PRELIMINARY RESULTS* Echocardiogram 2D Echocardiogram has been performed.  Bobby [name] 11/15/2018, 11:24 AM

## 2018-11-18 DIAGNOSIS — S82121D Displaced fracture of lateral condyle of right tibia, subsequent encounter for closed fracture with routine healing: Secondary | ICD-10-CM | POA: Diagnosis not present

## 2018-11-25 DIAGNOSIS — I82409 Acute embolism and thrombosis of unspecified deep veins of unspecified lower extremity: Secondary | ICD-10-CM | POA: Diagnosis not present

## 2018-11-25 DIAGNOSIS — S8990XA Unspecified injury of unspecified lower leg, initial encounter: Secondary | ICD-10-CM | POA: Diagnosis not present

## 2018-11-25 DIAGNOSIS — F411 Generalized anxiety disorder: Secondary | ICD-10-CM | POA: Diagnosis not present

## 2018-11-25 DIAGNOSIS — I2699 Other pulmonary embolism without acute cor pulmonale: Secondary | ICD-10-CM | POA: Diagnosis not present

## 2018-12-16 DIAGNOSIS — S82121D Displaced fracture of lateral condyle of right tibia, subsequent encounter for closed fracture with routine healing: Secondary | ICD-10-CM | POA: Diagnosis not present

## 2018-12-24 DIAGNOSIS — I2699 Other pulmonary embolism without acute cor pulmonale: Secondary | ICD-10-CM | POA: Diagnosis not present

## 2018-12-24 DIAGNOSIS — F411 Generalized anxiety disorder: Secondary | ICD-10-CM | POA: Diagnosis not present

## 2018-12-24 DIAGNOSIS — S8990XA Unspecified injury of unspecified lower leg, initial encounter: Secondary | ICD-10-CM | POA: Diagnosis not present

## 2018-12-24 DIAGNOSIS — I82409 Acute embolism and thrombosis of unspecified deep veins of unspecified lower extremity: Secondary | ICD-10-CM | POA: Diagnosis not present

## 2018-12-26 ENCOUNTER — Other Ambulatory Visit (HOSPITAL_COMMUNITY): Payer: Self-pay | Admitting: Family Medicine

## 2018-12-26 DIAGNOSIS — M25562 Pain in left knee: Secondary | ICD-10-CM

## 2018-12-31 DIAGNOSIS — M25561 Pain in right knee: Secondary | ICD-10-CM | POA: Diagnosis not present

## 2019-01-02 ENCOUNTER — Other Ambulatory Visit: Payer: Self-pay

## 2019-01-02 ENCOUNTER — Ambulatory Visit (HOSPITAL_COMMUNITY)
Admission: RE | Admit: 2019-01-02 | Discharge: 2019-01-02 | Disposition: A | Discharge status: Home / Self Care | Payer: BC Managed Care – PPO | Source: Ambulatory Visit | Attending: Family Medicine | Admitting: Family Medicine

## 2019-01-02 DIAGNOSIS — S8002XA Contusion of left knee, initial encounter: Secondary | ICD-10-CM | POA: Diagnosis not present

## 2019-01-02 DIAGNOSIS — M25562 Pain in left knee: Secondary | ICD-10-CM

## 2019-01-05 DIAGNOSIS — M25561 Pain in right knee: Secondary | ICD-10-CM | POA: Diagnosis not present

## 2019-01-07 DIAGNOSIS — M25561 Pain in right knee: Secondary | ICD-10-CM | POA: Diagnosis not present

## 2019-01-09 DIAGNOSIS — S83412A Sprain of medial collateral ligament of left knee, initial encounter: Secondary | ICD-10-CM | POA: Diagnosis not present

## 2019-01-12 DIAGNOSIS — M25561 Pain in right knee: Secondary | ICD-10-CM | POA: Diagnosis not present

## 2019-01-14 DIAGNOSIS — M25561 Pain in right knee: Secondary | ICD-10-CM | POA: Diagnosis not present

## 2019-01-19 DIAGNOSIS — M25561 Pain in right knee: Secondary | ICD-10-CM | POA: Diagnosis not present

## 2019-01-21 DIAGNOSIS — M25561 Pain in right knee: Secondary | ICD-10-CM | POA: Diagnosis not present

## 2019-01-26 DIAGNOSIS — M25561 Pain in right knee: Secondary | ICD-10-CM | POA: Diagnosis not present

## 2019-01-27 DIAGNOSIS — S83281D Other tear of lateral meniscus, current injury, right knee, subsequent encounter: Secondary | ICD-10-CM | POA: Diagnosis not present

## 2019-01-27 DIAGNOSIS — S82121D Displaced fracture of lateral condyle of right tibia, subsequent encounter for closed fracture with routine healing: Secondary | ICD-10-CM | POA: Diagnosis not present

## 2019-01-27 DIAGNOSIS — S83411D Sprain of medial collateral ligament of right knee, subsequent encounter: Secondary | ICD-10-CM | POA: Diagnosis not present

## 2019-01-28 DIAGNOSIS — M25561 Pain in right knee: Secondary | ICD-10-CM | POA: Diagnosis not present

## 2019-02-16 DIAGNOSIS — M25561 Pain in right knee: Secondary | ICD-10-CM | POA: Diagnosis not present

## 2019-02-18 DIAGNOSIS — M25561 Pain in right knee: Secondary | ICD-10-CM | POA: Diagnosis not present

## 2019-02-23 DIAGNOSIS — M25561 Pain in right knee: Secondary | ICD-10-CM | POA: Diagnosis not present

## 2019-02-25 DIAGNOSIS — M25561 Pain in right knee: Secondary | ICD-10-CM | POA: Diagnosis not present

## 2019-03-02 DIAGNOSIS — M25561 Pain in right knee: Secondary | ICD-10-CM | POA: Diagnosis not present

## 2019-03-03 DIAGNOSIS — S82121D Displaced fracture of lateral condyle of right tibia, subsequent encounter for closed fracture with routine healing: Secondary | ICD-10-CM | POA: Diagnosis not present

## 2019-03-04 DIAGNOSIS — M25561 Pain in right knee: Secondary | ICD-10-CM | POA: Diagnosis not present

## 2019-03-10 DIAGNOSIS — M25561 Pain in right knee: Secondary | ICD-10-CM | POA: Diagnosis not present

## 2019-03-12 DIAGNOSIS — M25561 Pain in right knee: Secondary | ICD-10-CM | POA: Diagnosis not present

## 2019-03-16 DIAGNOSIS — M25561 Pain in right knee: Secondary | ICD-10-CM | POA: Diagnosis not present

## 2019-03-26 DIAGNOSIS — M25561 Pain in right knee: Secondary | ICD-10-CM | POA: Diagnosis not present

## 2019-04-28 DIAGNOSIS — S8990XA Unspecified injury of unspecified lower leg, initial encounter: Secondary | ICD-10-CM | POA: Diagnosis not present

## 2019-04-28 DIAGNOSIS — F411 Generalized anxiety disorder: Secondary | ICD-10-CM | POA: Diagnosis not present

## 2019-04-28 DIAGNOSIS — I2699 Other pulmonary embolism without acute cor pulmonale: Secondary | ICD-10-CM | POA: Diagnosis not present

## 2019-04-28 DIAGNOSIS — I82409 Acute embolism and thrombosis of unspecified deep veins of unspecified lower extremity: Secondary | ICD-10-CM | POA: Diagnosis not present

## 2019-06-01 DIAGNOSIS — M25561 Pain in right knee: Secondary | ICD-10-CM | POA: Diagnosis not present

## 2019-06-04 ENCOUNTER — Encounter: Payer: Self-pay | Admitting: Internal Medicine

## 2019-06-04 ENCOUNTER — Other Ambulatory Visit: Payer: Self-pay

## 2019-06-04 ENCOUNTER — Encounter: Payer: Self-pay | Admitting: Gastroenterology

## 2019-06-04 ENCOUNTER — Ambulatory Visit: Payer: BC Managed Care – PPO | Admitting: Gastroenterology

## 2019-06-04 VITALS — BP 117/79 | HR 63 | Temp 96.8°F | Ht 73.0 in | Wt 257.6 lb

## 2019-06-04 DIAGNOSIS — K200 Eosinophilic esophagitis: Secondary | ICD-10-CM | POA: Diagnosis not present

## 2019-06-04 DIAGNOSIS — R1310 Dysphagia, unspecified: Secondary | ICD-10-CM | POA: Diagnosis not present

## 2019-06-04 MED ORDER — PANTOPRAZOLE SODIUM 40 MG PO TBEC: 40.0000 mg | DELAYED_RELEASE_TABLET | Freq: Two times a day (BID) | ORAL | 3 refills | Status: DC

## 2019-06-04 NOTE — Progress Notes (Signed)
Cc'ed to pcp °
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
## 2019-06-04 NOTE — Patient Instructions (Addendum)
I have sent in pantoprazole (Protonix) to take twice a day, 30 minutes before breakfast and dinner. This is best absorbed on an empty stomach.   We are arranging an upper endoscopy with dilation in the near future! In the meantime, stick with soft foods, chew well, sit upright while eating, take small bites.  We are referring you to an Allergy Specialist as well due to your history of eosinophilic esophagitis.   We will see you in 3 months to ensure you are doing well!  Here is a link for further information: [email or web address]  It was a pleasure to see you today. I want to create trusting relationships with patients to provide genuine, compassionate, and quality care. I value your feedback. If you receive a survey regarding your visit,  I greatly appreciate you taking time to fill this out.   Gelene Mink, PhD, ANP-BC [facility] Gastroenterology

## 2019-06-08 ENCOUNTER — Telehealth: Payer: Self-pay

## 2019-06-08 ENCOUNTER — Other Ambulatory Visit: Payer: Self-pay

## 2019-06-08 DIAGNOSIS — K200 Eosinophilic esophagitis: Secondary | ICD-10-CM

## 2019-06-08 NOTE — Telephone Encounter (Signed)
Called pt, EGD/DIL w/Prop w/RMR scheduled for 06/15/19 at 9:00am. Orders entered.

## 2019-06-08 NOTE — Telephone Encounter (Signed)
Called [place], no PA needed for EGD/DIL. Call Ref# [id number].

## 2019-06-08 NOTE — Telephone Encounter (Signed)
Called and informed pt of pre-op and COVID test appt 06/15/19. Instructions given on phone for procedure.   Appt letter and procedure instructions also mailed but pt may not receive in time before procedure.

## 2019-06-09 DIAGNOSIS — S82121D Displaced fracture of lateral condyle of right tibia, subsequent encounter for closed fracture with routine healing: Secondary | ICD-10-CM | POA: Diagnosis not present

## 2019-06-09 DIAGNOSIS — S83241A Other tear of medial meniscus, current injury, right knee, initial encounter: Secondary | ICD-10-CM | POA: Diagnosis not present

## 2019-06-09 DIAGNOSIS — S82241A Displaced spiral fracture of shaft of right tibia, initial encounter for closed fracture: Secondary | ICD-10-CM | POA: Diagnosis not present

## 2019-06-10 ENCOUNTER — Encounter (HOSPITAL_COMMUNITY): Payer: Self-pay

## 2019-06-10 ENCOUNTER — Other Ambulatory Visit: Payer: Self-pay

## 2019-06-11 ENCOUNTER — Encounter (HOSPITAL_COMMUNITY)
Admission: RE | Admit: 2019-06-11 | Discharge: 2019-06-11 | Disposition: A | Discharge status: Home / Self Care | Payer: BC Managed Care – PPO | Source: Ambulatory Visit | Attending: Internal Medicine | Admitting: Internal Medicine

## 2019-06-11 ENCOUNTER — Other Ambulatory Visit (HOSPITAL_COMMUNITY): Admission: RE | Admit: 2019-06-11 | Payer: No Typology Code available for payment source | Source: Ambulatory Visit

## 2019-06-12 ENCOUNTER — Other Ambulatory Visit: Payer: Self-pay

## 2019-06-12 ENCOUNTER — Encounter (HOSPITAL_COMMUNITY)
Admission: RE | Admit: 2019-06-12 | Discharge: 2019-06-12 | Disposition: A | Discharge status: Home / Self Care | Payer: BC Managed Care – PPO | Source: Ambulatory Visit | Attending: Internal Medicine | Admitting: Internal Medicine

## 2019-06-12 ENCOUNTER — Other Ambulatory Visit (HOSPITAL_COMMUNITY)
Admission: RE | Admit: 2019-06-12 | Discharge: 2019-06-12 | Disposition: A | Discharge status: Home / Self Care | Payer: BC Managed Care – PPO | Source: Ambulatory Visit | Attending: Internal Medicine | Admitting: Internal Medicine

## 2019-06-12 DIAGNOSIS — Z20822 Contact with and (suspected) exposure to covid-19: Secondary | ICD-10-CM | POA: Insufficient documentation

## 2019-06-12 DIAGNOSIS — Z01812 Encounter for preprocedural laboratory examination: Secondary | ICD-10-CM | POA: Insufficient documentation

## 2019-06-12 LAB — SARS CORONAVIRUS 2 (TAT 6-24 HRS): SARS Coronavirus 2: NEGATIVE

## 2019-06-15 ENCOUNTER — Ambulatory Visit (HOSPITAL_COMMUNITY): Payer: BC Managed Care – PPO | Admitting: Anesthesiology

## 2019-06-15 ENCOUNTER — Ambulatory Visit (HOSPITAL_COMMUNITY)
Admission: RE | Admit: 2019-06-15 | Discharge: 2019-06-15 | Disposition: A | Discharge status: Home / Self Care | Payer: BC Managed Care – PPO | Attending: Internal Medicine | Admitting: Internal Medicine

## 2019-06-15 ENCOUNTER — Encounter (HOSPITAL_COMMUNITY): Payer: Self-pay | Admitting: Internal Medicine

## 2019-06-15 ENCOUNTER — Encounter (HOSPITAL_COMMUNITY)
Admission: RE | Disposition: A | Discharge status: Home / Self Care | Payer: Self-pay | Source: Home / Self Care | Attending: Internal Medicine

## 2019-06-15 DIAGNOSIS — K219 Gastro-esophageal reflux disease without esophagitis: Secondary | ICD-10-CM | POA: Insufficient documentation

## 2019-06-15 DIAGNOSIS — Z87891 Personal history of nicotine dependence: Secondary | ICD-10-CM | POA: Diagnosis not present

## 2019-06-15 DIAGNOSIS — Z86711 Personal history of pulmonary embolism: Secondary | ICD-10-CM | POA: Insufficient documentation

## 2019-06-15 DIAGNOSIS — R1310 Dysphagia, unspecified: Secondary | ICD-10-CM | POA: Diagnosis not present

## 2019-06-15 DIAGNOSIS — K228 Other specified diseases of esophagus: Secondary | ICD-10-CM | POA: Diagnosis not present

## 2019-06-15 DIAGNOSIS — K209 Esophagitis, unspecified without bleeding: Secondary | ICD-10-CM | POA: Diagnosis not present

## 2019-06-15 DIAGNOSIS — Z86718 Personal history of other venous thrombosis and embolism: Secondary | ICD-10-CM | POA: Insufficient documentation

## 2019-06-15 HISTORY — PX: MALONEY DILATION: SHX5535

## 2019-06-15 HISTORY — PX: BIOPSY: SHX5522

## 2019-06-15 HISTORY — PX: ESOPHAGOGASTRODUODENOSCOPY (EGD) WITH PROPOFOL: SHX5813

## 2019-06-15 SURGERY — ESOPHAGOGASTRODUODENOSCOPY (EGD) WITH PROPOFOL
Anesthesia: General

## 2019-06-15 MED ORDER — PROPOFOL 10 MG/ML IV BOLUS
INTRAVENOUS | Status: DC | PRN
  Administered 2019-06-15: 20 mg via INTRAVENOUS

## 2019-06-15 MED ORDER — MIDAZOLAM HCL 2 MG/2ML IJ SOLN
INTRAMUSCULAR | Status: AC
  Filled 2019-06-15: qty 2

## 2019-06-15 MED ORDER — MIDAZOLAM HCL 5 MG/5ML IJ SOLN
INTRAMUSCULAR | Status: DC | PRN
  Administered 2019-06-15: 2 mg via INTRAVENOUS

## 2019-06-15 MED ORDER — LACTATED RINGERS IV SOLN: INTRAVENOUS | Status: DC

## 2019-06-15 MED ORDER — KETAMINE HCL 10 MG/ML IJ SOLN
INTRAMUSCULAR | Status: DC | PRN
  Administered 2019-06-15: 20 mg via INTRAVENOUS

## 2019-06-15 MED ORDER — PROPOFOL 10 MG/ML IV BOLUS
INTRAVENOUS | Status: AC
  Filled 2019-06-15: qty 40

## 2019-06-15 MED ORDER — PROPOFOL 500 MG/50ML IV EMUL
INTRAVENOUS | Status: DC | PRN
  Administered 2019-06-15: 150 ug/kg/min via INTRAVENOUS

## 2019-06-15 MED ORDER — KETAMINE HCL 50 MG/5ML IJ SOSY
PREFILLED_SYRINGE | INTRAMUSCULAR | Status: AC
  Filled 2019-06-15: qty 5

## 2019-06-15 NOTE — Anesthesia Preprocedure Evaluation (Signed)
Anesthesia Evaluation  Patient identified by MRN, date of birth, ID band Patient awake    Reviewed: Allergy & Precautions, NPO status , Patient's Chart, lab work & pertinent test results  Airway Mallampati: II  TM Distance: >3 FB Neck ROM: Full    Dental no notable dental hx. (+) Teeth Intact   Pulmonary former smoker, PE PE after TPF ORIF-was on Eliquis x 6 months  Had DVT prior to PE   Pulmonary exam normal breath sounds clear to auscultation       Cardiovascular Exercise Tolerance: Good negative cardio ROS Normal cardiovascular examI Rhythm:Regular Rate:Normal     Neuro/Psych negative neurological ROS  negative psych ROS   GI/Hepatic Neg liver ROS, GERD  Medicated and Controlled,Here for EGD -h/o Eo Esophagitis   Endo/Other  negative endocrine ROS  Renal/GU negative Renal ROS  negative genitourinary   Musculoskeletal  (+) Arthritis , Osteoarthritis,    Abdominal   Peds negative pediatric ROS (+)  Hematology negative hematology ROS (+)   Anesthesia Other Findings   Reproductive/Obstetrics negative OB ROS                             Anesthesia Physical Anesthesia Plan  ASA: II  Anesthesia Plan: General   Post-op Pain Management:    Induction: Intravenous  PONV Risk Score and Plan: 2 and Propofol infusion and TIVA  Airway Management Planned: Nasal Cannula and Simple Face Mask  Additional Equipment:   Intra-op Plan:   Post-operative Plan:   Informed Consent: I have reviewed the patients History and Physical, chart, labs and discussed the procedure including the risks, benefits and alternatives for the proposed anesthesia with the patient or authorized representative who has indicated his/her understanding and acceptance.     Dental advisory given  Plan Discussed with: CRNA  Anesthesia Plan Comments: (Plan Full PPE use  Plan GA with GETA as needed d/w pt -WTP with  same after Q&A)        Anesthesia Quick Evaluation

## 2019-06-15 NOTE — Op Note (Signed)
[facility] Patient Name: Bobby [name] Procedure Date: 06/15/2019 9:01 AM MRN: [id number] Date of Birth: 27-Mar-1976 Attending MD: Gennette Pac , MD CSN: [id number] Age: 44 Admit Type: Outpatient Procedure:                Upper GI endoscopy Indications:              Dysphagia Providers:                Gennette Pac, MD, Criselda Peaches. Patsy Lager, RN,                            Dyann Ruddle Referring MD:              Medicines:                Propofol per Anesthesia Complications:            No immediate complications. Estimated Blood Loss:     Estimated blood loss was minimal. Estimated blood                            loss was minimal. Estimated blood loss was minimal. Procedure:                Pre-Anesthesia Assessment:                           - Prior to the procedure, a History and Physical                            was performed, and patient medications and                            allergies were reviewed. The patient's tolerance of                            previous anesthesia was also reviewed. The risks                            and benefits of the procedure and the sedation                            options and risks were discussed with the patient.                            All questions were answered, and informed consent                            was obtained. Prior Anticoagulants: The patient has                            taken no previous anticoagulant or antiplatelet                            agents. ASA Grade Assessment: II - A patient with  mild systemic disease. After reviewing the risks                            and benefits, the patient was deemed in                            satisfactory condition to undergo the procedure.                           After obtaining informed consent, the endoscope was                            passed under direct vision. Throughout the                            procedure, the  patient's blood pressure, pulse, and                            oxygen saturations were monitored continuously. The                            GIF-H190 ([id number]) scope was introduced through the                            mouth, and advanced to the second part of duodenum.                            The upper GI endoscopy was accomplished without                            difficulty. The patient tolerated the procedure                            well. Scope In: 9:24:11 AM Scope Out: 9:32:04 AM Total Procedure Duration: 0 hours 7 minutes 53 seconds  Findings:      Some linear furrowing of the tubular esophagus. . The scope was       withdrawn.      The entire examined stomach was normal.      The duodenal bulb and second portion of the duodenum were normal.       Appeared patent throughout its course. The scope was withdrawn. Dilation       was performed with a Maloney dilator with mild resistance at 54 Fr. The       dilation site was examined and showed mild mucosal disruption. Estimated       blood loss was minimal. Finally, this was biopsied with a cold forceps       for histology. Estimated blood loss was minimal. Impression:               -Abnormal esophagus suspicious for EOE. Dilated and                            biopsied- Normal stomach.                           -  Normal duodenal bulb and second portion of the                            duodenum. Moderate Sedation:      Moderate (conscious) sedation was personally administered by an       anesthesia professional. The following parameters were monitored: oxygen       saturation, heart rate, blood pressure, respiratory rate, EKG, adequacy       of pulmonary ventilation, and response to care. Recommendation:           - Patient has a contact number available for                            emergencies. The signs and symptoms of potential                            delayed complications were discussed with the                             patient. Return to normal activities tomorrow.                            Written discharge instructions were provided to the                            patient.                           - Advance diet as tolerated.                           - Continue present medications. Tinea Protonix 40                            mg twice daily. Follow-up on pathology.                           - Return to my office in 6 months. Procedure Code(s):        --- Professional ---                           [phone], Esophagogastroduodenoscopy, flexible,                            transoral; with biopsy, single or multiple                           43450, Dilation of esophagus, by unguided sound or                            bougie, single or multiple passes Diagnosis Code(s):        --- Professional ---                           K20.90, Esophagitis, unspecified without bleeding  R13.10, Dysphagia, unspecified CPT copyright 2019 American Medical Association. All rights reserved. The codes documented in this report are preliminary and upon coder review may  be revised to meet current compliance requirements. Cristopher Estimable. Lillyrose Reitan, MD Norvel Richards, MD 06/15/2019 9:43:03 AM This report has been signed electronically. Number of Addenda: 0

## 2019-06-15 NOTE — Discharge Instructions (Signed)
EGD Discharge instructions Please read the instructions outlined below and refer to this sheet in the next few weeks. These discharge instructions provide you with general information on caring for yourself after you leave the hospital. Your doctor may also give you specific instructions. While your treatment has been planned according to the most current medical practices available, unavoidable complications occasionally occur. If you have any problems or questions after discharge, please call your doctor. ACTIVITY  You may resume your regular activity but move at a slower pace for the next 24 hours.   Take frequent rest periods for the next 24 hours.   Walking will help expel (get rid of) the air and reduce the bloated feeling in your abdomen.   No driving for 24 hours (because of the anesthesia (medicine) used during the test).   You may shower.   Do not sign any important legal documents or operate any machinery for 24 hours (because of the anesthesia used during the test).  NUTRITION  Drink plenty of fluids.   You may resume your normal diet.   Begin with a light meal and progress to your normal diet.   Avoid alcoholic beverages for 24 hours or as instructed by your caregiver.  MEDICATIONS  You may resume your normal medications unless your caregiver tells you otherwise.  WHAT YOU CAN EXPECT TODAY  You may experience abdominal discomfort such as a feeling of fullness or "gas" pains.  FOLLOW-UP  Your doctor will discuss the results of your test with you.  SEEK IMMEDIATE MEDICAL ATTENTION IF ANY OF THE FOLLOWING OCCUR:  Excessive nausea (feeling sick to your stomach) and/or vomiting.   Severe abdominal pain and distention (swelling).   Trouble swallowing.   Temperature over 101 F (37.8 C).   Rectal bleeding or vomiting of blood.   Continue Protonix 40 mg twice daily  Follow through with the allergist appointment  Further recommendations to follow pending review  of pathology report  Office visit with [place] in 6 months  Patient request, I called Lauren at [phone], reviewed results.

## 2019-06-15 NOTE — Transfer of Care (Signed)
Immediate Anesthesia Transfer of Care Note  Patient: Bobby [name]  Procedure(s) Performed: ESOPHAGOGASTRODUODENOSCOPY (EGD) WITH PROPOFOL (N/A ) MALONEY DILATION (N/A ) BIOPSY  Patient Location: PACU  Anesthesia Type:General  Level of Consciousness: awake  Airway & Oxygen Therapy: Patient Spontanous Breathing  Post-op Assessment: Report given to RN  Post vital signs: Reviewed and stable  Last Vitals:  Vitals Value Taken Time  BP    Temp    Pulse    Resp 13 06/15/19 0940  SpO2    Vitals shown include unvalidated device data.  Last Pain:  Vitals:   06/15/19 0919  PainSc: 0-No pain         Complications: No apparent anesthesia complications

## 2019-06-15 NOTE — Anesthesia Postprocedure Evaluation (Signed)
Anesthesia Post Note  Patient: [name]  Procedure(s) Performed: ESOPHAGOGASTRODUODENOSCOPY (EGD) WITH PROPOFOL (N/A ) MALONEY DILATION (N/A ) BIOPSY  Patient location during evaluation: PACU Anesthesia Type: General Level of consciousness: oriented and awake and alert Pain management: pain level controlled Vital Signs Assessment: post-procedure vital signs reviewed and stable Respiratory status: spontaneous breathing Cardiovascular status: blood pressure returned to baseline and stable Postop Assessment: no apparent nausea or vomiting Anesthetic complications: no     Last Vitals:  Vitals:   06/15/19 0756  BP: 111/76  Pulse: 69  Resp: 16  Temp: 36.6 C  SpO2: 96%    Last Pain:  Vitals:   06/15/19 0919  PainSc: 0-No pain                 Mailen Newborn

## 2019-06-15 NOTE — Interval H&P Note (Signed)
History and Physical Interval Note:  06/15/2019 9:09 AM  Aris Lot  has presented today for surgery, with the diagnosis of eosinophilic esophagitis, dysphagia.  The various methods of treatment have been discussed with the patient and family. After consideration of risks, benefits and other options for treatment, the patient has consented to  Procedure(s) with comments: ESOPHAGOGASTRODUODENOSCOPY (EGD) WITH PROPOFOL (N/A) - 9:00am MALONEY DILATION (N/A) as a surgical intervention.  The patient's history has been reviewed, patient examined, no change in status, stable for surgery.  I have reviewed the patient's chart and labs.  Questions were answered to the patient's satisfaction.     Molly Maduro Tadd Holtmeyer  Twice daily Protonix has helped reflux and dysphagia.  Seeing an allergist in the near future.  Otherwise no change.  EGD with esophageal dilation as feasible/appropriate today  The risks, benefits, limitations, alternatives and imponderables have been reviewed with the patient. Potential for esophageal dilation, biopsy, etc. have also been reviewed.  Questions have been answered. All parties agreeable.

## 2019-06-16 ENCOUNTER — Encounter: Payer: Self-pay | Admitting: Internal Medicine

## 2019-06-16 LAB — SURGICAL PATHOLOGY

## 2019-06-24 ENCOUNTER — Other Ambulatory Visit: Payer: Self-pay | Admitting: Student

## 2019-06-24 DIAGNOSIS — M25469 Effusion, unspecified knee: Secondary | ICD-10-CM

## 2019-06-24 DIAGNOSIS — M25569 Pain in unspecified knee: Secondary | ICD-10-CM

## 2019-06-30 ENCOUNTER — Ambulatory Visit
Admission: RE | Admit: 2019-06-30 | Discharge: 2019-06-30 | Disposition: A | Discharge status: Home / Self Care | Payer: BC Managed Care – PPO | Source: Ambulatory Visit | Attending: Student | Admitting: Student

## 2019-06-30 ENCOUNTER — Other Ambulatory Visit: Payer: Self-pay

## 2019-06-30 DIAGNOSIS — M25461 Effusion, right knee: Secondary | ICD-10-CM | POA: Diagnosis not present

## 2019-06-30 DIAGNOSIS — M25469 Effusion, unspecified knee: Secondary | ICD-10-CM

## 2019-06-30 DIAGNOSIS — M25569 Pain in unspecified knee: Secondary | ICD-10-CM

## 2019-07-03 ENCOUNTER — Ambulatory Visit (INDEPENDENT_AMBULATORY_CARE_PROVIDER_SITE_OTHER): Payer: BC Managed Care – PPO | Admitting: Allergy & Immunology

## 2019-07-03 ENCOUNTER — Other Ambulatory Visit: Payer: Self-pay

## 2019-07-03 ENCOUNTER — Encounter: Payer: Self-pay | Admitting: Allergy & Immunology

## 2019-07-03 VITALS — BP 112/62 | HR 87 | Temp 98.4°F | Resp 17 | Wt 264.4 lb

## 2019-07-03 DIAGNOSIS — K200 Eosinophilic esophagitis: Secondary | ICD-10-CM | POA: Diagnosis not present

## 2019-07-03 NOTE — Patient Instructions (Addendum)
1. Eosinophilic esophagitis - Testing was reactive only to casein (stable part of cow's milk) and tree nuts. - Avoid all dairy and tree nuts for the next month. - If there is no improvement, put the foods back into your diet. - Continue with the pantoprazole, per Dr. Jena Gauss. - Dr. Jena Gauss might want to start a swallowed steroid like Flovent. - If symptoms persist or progress, a trial six food elimination diet (milk, soy, eggs, wheat, peanuts/tree nuts, and seafood) is a reasonable consideration.  2. Return in about 3 months (around 10/03/2019). This can be an in-person, a virtual Webex or a telephone follow up visit.   Please inform us of any Emergency Department visits, hospitalizations, or changes in symptoms. Call us before going to the ED for breathing or allergy symptoms since we might be able to fit you in for a sick visit. Feel free to contact us anytime with any questions, problems, or concerns.  It was a pleasure to meet you today!  Websites that have reliable patient information: 1. American Academy of Asthma, Allergy, and Immunology: www.aaaai.org 2. Food Allergy Research and Education (FARE): foodallergy.org 3. Mothers of Asthmatics: http://www.asthmacommunitynetwork.org 4. American College of Allergy, Asthma, and Immunology: www.acaai.org   COVID-19 Vaccine Information can be found at: [email or web address].nl For questions related to vaccine distribution or appointments, please email [email or web address] or call [phone].     "Like" [place] on Facebook and Instagram for our latest updates!        Make sure you are registered to vote! If you have moved or changed any of your contact information, you will need to get this updated before voting!  In some cases, you MAY be able to register to vote online: [email or web address]
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
## 2019-07-04 ENCOUNTER — Encounter: Payer: Self-pay | Admitting: Allergy & Immunology

## 2019-07-07 DIAGNOSIS — M25561 Pain in right knee: Secondary | ICD-10-CM | POA: Diagnosis not present

## 2019-07-15 ENCOUNTER — Other Ambulatory Visit: Payer: BC Managed Care – PPO

## 2019-07-17 DIAGNOSIS — K439 Ventral hernia without obstruction or gangrene: Secondary | ICD-10-CM | POA: Diagnosis not present

## 2019-07-17 DIAGNOSIS — Z6835 Body mass index (BMI) 35.0-35.9, adult: Secondary | ICD-10-CM | POA: Diagnosis not present

## 2019-07-23 DIAGNOSIS — N2889 Other specified disorders of kidney and ureter: Secondary | ICD-10-CM | POA: Diagnosis not present

## 2019-07-23 DIAGNOSIS — R1900 Intra-abdominal and pelvic swelling, mass and lump, unspecified site: Secondary | ICD-10-CM | POA: Diagnosis not present

## 2019-07-31 ENCOUNTER — Other Ambulatory Visit: Payer: Self-pay | Admitting: Family Medicine

## 2019-07-31 DIAGNOSIS — N289 Disorder of kidney and ureter, unspecified: Secondary | ICD-10-CM

## 2019-08-29 ENCOUNTER — Ambulatory Visit
Admission: RE | Admit: 2019-08-29 | Discharge: 2019-08-29 | Disposition: A | Discharge status: Home / Self Care | Payer: BC Managed Care – PPO | Source: Ambulatory Visit | Attending: Family Medicine | Admitting: Family Medicine

## 2019-08-29 DIAGNOSIS — N289 Disorder of kidney and ureter, unspecified: Secondary | ICD-10-CM

## 2019-08-29 DIAGNOSIS — N2889 Other specified disorders of kidney and ureter: Secondary | ICD-10-CM | POA: Diagnosis not present

## 2019-08-29 MED ORDER — GADOBENATE DIMEGLUMINE 529 MG/ML IV SOLN
20.0000 mL | Freq: Once | INTRAVENOUS | Status: AC | PRN
  Administered 2019-08-29: 10:00:00 20 mL via INTRAVENOUS

## 2019-09-03 ENCOUNTER — Ambulatory Visit: Payer: BC Managed Care – PPO | Admitting: Gastroenterology

## 2019-09-15 DIAGNOSIS — N289 Disorder of kidney and ureter, unspecified: Secondary | ICD-10-CM | POA: Diagnosis not present

## 2019-09-15 DIAGNOSIS — N2889 Other specified disorders of kidney and ureter: Secondary | ICD-10-CM | POA: Diagnosis not present

## 2019-09-30 DIAGNOSIS — X58XXXA Exposure to other specified factors, initial encounter: Secondary | ICD-10-CM | POA: Diagnosis not present

## 2019-09-30 DIAGNOSIS — M2241 Chondromalacia patellae, right knee: Secondary | ICD-10-CM | POA: Diagnosis not present

## 2019-09-30 DIAGNOSIS — G8918 Other acute postprocedural pain: Secondary | ICD-10-CM | POA: Diagnosis not present

## 2019-09-30 DIAGNOSIS — S83271A Complex tear of lateral meniscus, current injury, right knee, initial encounter: Secondary | ICD-10-CM | POA: Diagnosis not present

## 2019-09-30 DIAGNOSIS — M94261 Chondromalacia, right knee: Secondary | ICD-10-CM | POA: Diagnosis not present

## 2019-09-30 DIAGNOSIS — M1711 Unilateral primary osteoarthritis, right knee: Secondary | ICD-10-CM | POA: Diagnosis not present

## 2019-09-30 DIAGNOSIS — S83281A Other tear of lateral meniscus, current injury, right knee, initial encounter: Secondary | ICD-10-CM | POA: Diagnosis not present

## 2019-09-30 DIAGNOSIS — S83231A Complex tear of medial meniscus, current injury, right knee, initial encounter: Secondary | ICD-10-CM | POA: Diagnosis not present

## 2019-09-30 DIAGNOSIS — S83241A Other tear of medial meniscus, current injury, right knee, initial encounter: Secondary | ICD-10-CM | POA: Diagnosis not present

## 2019-09-30 DIAGNOSIS — Y999 Unspecified external cause status: Secondary | ICD-10-CM | POA: Diagnosis not present

## 2019-10-02 ENCOUNTER — Ambulatory Visit: Payer: BC Managed Care – PPO | Admitting: Allergy & Immunology

## 2019-10-08 DIAGNOSIS — M2241 Chondromalacia patellae, right knee: Secondary | ICD-10-CM | POA: Diagnosis not present

## 2019-11-05 DIAGNOSIS — M2241 Chondromalacia patellae, right knee: Secondary | ICD-10-CM | POA: Diagnosis not present

## 2019-12-04 DIAGNOSIS — M2241 Chondromalacia patellae, right knee: Secondary | ICD-10-CM | POA: Diagnosis not present

## 2019-12-08 ENCOUNTER — Other Ambulatory Visit: Payer: Self-pay

## 2019-12-08 ENCOUNTER — Encounter: Payer: Self-pay | Admitting: Gastroenterology

## 2019-12-08 ENCOUNTER — Ambulatory Visit: Payer: BC Managed Care – PPO | Admitting: Gastroenterology

## 2019-12-08 VITALS — BP 133/88 | HR 67 | Temp 97.7°F | Ht 73.0 in | Wt 273.4 lb

## 2019-12-08 DIAGNOSIS — K200 Eosinophilic esophagitis: Secondary | ICD-10-CM | POA: Diagnosis not present

## 2019-12-08 NOTE — Progress Notes (Signed)
Referring Provider: Gareth Morgan, MD Primary Care Physician:  Lianne Moris, PA-C  Primary GI: Dr. Jena Gauss   Chief Complaint  Patient presents with  . Dysphagia    doing fine    HPI:   Bobby [name] is a 44 y.o. male presenting today with a history of EOE, s/p EGD recently with dilation and abnormal esophagus consistent with EOE. He is now taking a PPI BID.   If forgets several doses notes issues. Has trouble remembering sometimes, but for the most part he is taking BID. Dysphagia resolved if taking BID. He notes that he only has issues if he has accidentally missed multiple days in a row, so he feels like once daily dosing will still be beneficial and willing to try this.   Has a few knots that came up on right side. Went to PCP about this. Has areas in RUQ that were sensitive to touch, wondering if lipomas. No pain now. He had an MRI since that time due to indeterminate renal lesion on prior CT (I don't have prior CT). This showed small enhancing solid round lesion in lower pole of right kidney concerning for small renal neoplasm. He has seen Urology and will be seeing them again in Nov 2021. He is in active surveillance mode for this.   Past Medical History:  Diagnosis Date  . Arthritis    Right hand  . Eosinophilic esophagitis   . GERD (gastroesophageal reflux disease)   . Pulmonary embolism (HCC) 11/14/2018    Past Surgical History:  Procedure Laterality Date  . BIOPSY  06/15/2019   Procedure: BIOPSY;  Surgeon: Corbin Ade, MD;  [facility];  Service: Endoscopy;;  esophagus  . CHOLECYSTECTOMY    . ESOPHAGOGASTRODUODENOSCOPY (EGD) WITH ESOPHAGEAL DILATION N/A 11/13/2012   mild erosive reflux esophagitis s/p dilation. Biopsy of gastric and duodenal bulbar erosions. Gastritis in stomach. Esophageal biopsies with possible eosinophilic esophagitis.   Marland Kitchen ESOPHAGOGASTRODUODENOSCOPY (EGD) WITH PROPOFOL N/A 06/15/2019   Abnormal esophagus suspicious for EOE,  s/p dilation and biopsy. Normal stomach, normal duodenal bulb and second portion of duodenum. Dilation with 54 F. +EOE.   Marland Kitchen HAND SURGERY     right  . INGUINAL HERNIA REPAIR Right 08/17/2013   Procedure: RIGHT INGUINAL HERNIA REPAIR WITH MESH;  Surgeon: Dalia Heading, MD;  Location: [facility];  Service: General;  Laterality: Right;  . INSERTION OF MESH Right 08/17/2013   Procedure: INSERTION OF MESH;  Surgeon: Dalia Heading, MD;  Location: [facility];  Service: General;  Laterality: Right;  . MALONEY DILATION N/A 06/15/2019   Procedure: Elease Hashimoto DILATION;  Surgeon: Corbin Ade, MD;  [facility];  Service: Endoscopy;  Laterality: N/A;  . ORIF TIBIA PLATEAU Right 11/07/2018   Procedure: OPEN REDUCTION INTERNAL FIXATION (ORIF) TIBIAL PLATEAU;  Surgeon: Roby Lofts, MD;  [facility];  Service: Orthopedics;  Laterality: Right;  . SHOULDER SURGERY Right     Current Outpatient Medications  Medication Sig Dispense Refill  . celecoxib (CELEBREX) 200 MG capsule Take 200 mg by mouth daily.    . clonazePAM (KLONOPIN) 2 MG tablet Take 2 mg by mouth daily as needed for anxiety.     . pantoprazole (PROTONIX) 40 MG tablet Take 1 tablet (40 mg total) by mouth 2 (two) times daily before a meal. 60 tablet 3  . nabumetone (RELAFEN) 500 MG tablet Take 500 mg by mouth 2 (two) times daily as needed for moderate pain.  (  Patient not taking: Reported on 12/08/2019)     No current facility-administered medications for this visit.    Allergies as of 12/08/2019 - Review Complete 12/08/2019  Allergen Reaction Noted  . Amoxicillin Hives 08/03/2013  . Penicillins Hives 11/06/2018    Family History  Problem Relation Age of Onset  . Allergic rhinitis Brother   . Colon cancer Neg Hx   . Colon polyps Neg Hx     Social History   Socioeconomic History  . Marital status: Married    Spouse name: Not on file  . Number of children: Not on file  . Years of education: Not on file  . Highest education  level: Not on file  Occupational History  . Occupation: [employer]  Tobacco Use  . Smoking status: Former Smoker    Packs/day: 0.50    Years: 10.00    Pack years: 5.00    Types: Cigarettes  . Smokeless tobacco: Current User    Types: Chew  Vaping Use  . Vaping Use: Never used  Substance and Sexual Activity  . Alcohol use: Not Currently    Comment: rare  . Drug use: No  . Sexual activity: Yes  Other Topics Concern  . Not on file  Social History Narrative   ** Merged History Encounter **       Social Determinants of Health   Financial Resource Strain:   . Difficulty of Paying Living Expenses:   Food Insecurity:   . Worried About Programme researcher, broadcasting/film/video in the Last Year:   . Barista in the Last Year:   Transportation Needs:   . Freight forwarder (Medical):   Marland Kitchen Lack of Transportation (Non-Medical):   Physical Activity:   . Days of Exercise per Week:   . Minutes of Exercise per Session:   Stress:   . Feeling of Stress :   Social Connections:   . Frequency of Communication with Friends and Family:   . Frequency of Social Gatherings with Friends and Family:   . Attends Religious Services:   . Active Member of Clubs or Organizations:   . Attends Banker Meetings:   Marland Kitchen Marital Status:     Review of Systems: Gen: Denies fever, chills, anorexia. Denies fatigue, weakness, weight loss.  CV: Denies chest pain, palpitations, syncope, peripheral edema, and claudication. Resp: Denies dyspnea at rest, cough, wheezing, coughing up blood, and pleurisy. GI: see HPI Derm: Denies rash, itching, dry skin Psych: Denies depression, anxiety, memory loss, confusion. No homicidal or suicidal ideation.  Heme: Denies bruising, bleeding, and enlarged lymph nodes.  Physical Exam: BP 133/88   Pulse 67   Temp 97.7 F (36.5 C)   Ht 6\' 1"  (1.854 m)   Wt 273 lb 6.4 oz (124 kg)   BMI 36.07 kg/m  General:   Alert and oriented. No distress noted. Pleasant and cooperative.    Head:  Normocephalic and atraumatic. Eyes:  Conjuctiva clear without scleral icterus. Mouth:  Mask in place Abdomen:  +BS, soft, non-tender and non-distended. No rebound or guarding. No HSM. Small lipoma-type area superficially below right costal margin Msk:  Symmetrical without gross deformities. Normal posture. Extremities:  Without edema. Neurologic:  Alert and  oriented x4 Psych:  Alert and cooperative. Normal mood and affect.  ASSESSMENT: Bobby [name] is a 44 y.o. male presenting today with history of EOE, doing well s/p recent EGD with dilation and now on PPI BID. Only breakthrough issues are when he forgets multiple  dosing of PPI.  At this point, we can titrate down to once daily PPI. Discussed he may need BID dosing or even oral steroid therapy if BID dosing is not effective. I feel he will do well with once daily dosing.   Right-sided small lipoma-like structures reported per patient; I am unable to palpate this entirely due to body habitus, but he has an MRI on file as noted above and we discussed focused ultrasound if any changes. PCP aware as well. 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    PLAN:  Decrease PPI to once daily if tolerated. If not, return to BID  Call if any breakthrough despite once or BID dosing  Return in 1 year or sooner  Keep Urology appt upcoming due to renal lesion   Gelene Mink, PhD, ANP-BC [facility]

## 2019-12-08 NOTE — Patient Instructions (Signed)
You can decrease Protonix to once a day, 30 minutes before breakfast. If you have recurrent symptoms, then bump back up to twice a day.  If you still have symptoms on twice a day Protonix in the future, we will need to do a swallowed steroid medication.  We will see you back in 1 year or sooner if needed!  I enjoyed seeing you again today! As you know, I value our relationship and want to provide genuine, compassionate, and quality care. I welcome your feedback. If you receive a survey regarding your visit,  I greatly appreciate you taking time to fill this out. See you next time!  Gelene Mink, PhD, ANP-BC [facility]
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
## 2020-11-21 ENCOUNTER — Encounter: Payer: Self-pay | Admitting: Internal Medicine

## 2021-01-10 ENCOUNTER — Ambulatory Visit
Admission: EM | Admit: 2021-01-10 | Discharge: 2021-01-10 | Disposition: A | Discharge status: Home / Self Care | Payer: BC Managed Care – PPO | Attending: Family Medicine | Admitting: Family Medicine

## 2021-01-10 ENCOUNTER — Encounter: Payer: Self-pay | Admitting: Emergency Medicine

## 2021-01-10 ENCOUNTER — Other Ambulatory Visit: Payer: Self-pay

## 2021-01-10 DIAGNOSIS — J069 Acute upper respiratory infection, unspecified: Secondary | ICD-10-CM

## 2021-01-10 MED ORDER — PREDNISONE 20 MG PO TABS: 40.0000 mg | ORAL_TABLET | Freq: Every day | ORAL | 0 refills | Status: DC

## 2021-01-10 MED ORDER — PROMETHAZINE-DM 6.25-15 MG/5ML PO SYRP: 5.0000 mL | ORAL_SOLUTION | Freq: Four times a day (QID) | ORAL | 0 refills | Status: DC | PRN

## 2021-01-10 NOTE — ED Provider Notes (Signed)
[facility]   [id number] 01/10/21 Arrival Time: 1023  ASSESSMENT & PLAN:  1. Viral URI with cough    Discussed typical duration of viral illnesses. OTC symptom care as needed.  Meds ordered this encounter  Medications   promethazine-dextromethorphan (PROMETHAZINE-DM) 6.25-15 MG/5ML syrup    Sig: Take 5 mLs by mouth 4 (four) times daily as needed for cough.    Dispense:  118 mL    Refill:  0   predniSONE (DELTASONE) 20 MG tablet    Sig: Take 2 tablets (40 mg total) by mouth daily.    Dispense:  10 tablet    Refill:  0     Follow-up Information     Lianne Moris, PA-C.   Specialty: Family Medicine Why: As needed. Contact information: [address] [place] [address] [phone]                 Reviewed expectations re: course of current medical issues. Questions answered. Outlined signs and symptoms indicating need for more acute intervention. Understanding verbalized. After Visit Summary given.   SUBJECTIVE: History from: patient. Bobby [name] is a 45 y.o. male who reports sinus pressure/pain; dry cough; subj fever; past few days. Denies: difficulty breathing. Sinus pressure and cough bothering him the most. Normal PO intake without n/v/d.   OBJECTIVE:  Vitals:   01/10/21 1128  BP: 129/85  Pulse: 88  Resp: 18  Temp: 98.3 F (36.8 C)  TempSrc: Oral  SpO2: 98%    General appearance: alert; no distress Eyes: PERRLA; EOMI; conjunctiva normal HENT: [place]; AT; with nasal congestion; throat mild irritation Neck: supple  Lungs: speaks full sentences without difficulty; unlabored Extremities: no edema Skin: warm and dry Neurologic: normal gait Psychological: alert and cooperative; normal mood and affect    Allergies  Allergen Reactions   Amoxicillin Hives   Penicillins Hives    Did it involve swelling of the face/tongue/throat, SOB, or low BP? Yes Did it involve sudden or severe rash/hives, skin peeling, or any reaction on the  inside of your mouth or nose? Unk Did you need to seek medical attention at a hospital or doctor's office? Unk When did it last happen? Unk If all above answers are "NO", may proceed with cephalosporin use.     Past Medical History:  Diagnosis Date   Arthritis    Right hand   Eosinophilic esophagitis    GERD (gastroesophageal reflux disease)    Pulmonary embolism (HCC) 11/14/2018   Social History   Socioeconomic History   Marital status: Married    Spouse name: Not on file   Number of children: Not on file   Years of education: Not on file   Highest education level: Not on file  Occupational History   Occupation: [employer]  Tobacco Use   Smoking status: Former    Packs/day: 0.50    Years: 10.00    Pack years: 5.00    Types: Cigarettes   Smokeless tobacco: Current    Types: Associate Professor Use: Never used  Substance and Sexual Activity   Alcohol use: Not Currently    Comment: rare   Drug use: No   Sexual activity: Yes  Other Topics Concern   Not on file  Social History Narrative   ** Merged History Encounter **       Social Determinants of Health   Financial Resource Strain: Not on file  Food Insecurity: Not on file  Transportation Needs: Not on file  Physical Activity: Not on file  Stress: Not on file  Social Connections: Not on file  Intimate Partner Violence: Not on file   Family History  Problem Relation Age of Onset   Allergic rhinitis Brother    Colon cancer Neg Hx    Colon polyps Neg Hx    Past Surgical History:  Procedure Laterality Date   BIOPSY  06/15/2019   Procedure: BIOPSY;  Surgeon: Corbin Ade, MD;  [facility];  Service: Endoscopy;;  esophagus   CHOLECYSTECTOMY     ESOPHAGOGASTRODUODENOSCOPY (EGD) WITH ESOPHAGEAL DILATION N/A 11/13/2012   mild erosive reflux esophagitis s/p dilation. Biopsy of gastric and duodenal bulbar erosions. Gastritis in stomach. Esophageal biopsies with possible eosinophilic esophagitis.     ESOPHAGOGASTRODUODENOSCOPY (EGD) WITH PROPOFOL N/A 06/15/2019   Abnormal esophagus suspicious for EOE, s/p dilation and biopsy. Normal stomach, normal duodenal bulb and second portion of duodenum. Dilation with 54 F. +EOE.    HAND SURGERY     right   INGUINAL HERNIA REPAIR Right 08/17/2013   Procedure: RIGHT INGUINAL HERNIA REPAIR WITH MESH;  Surgeon: Dalia Heading, MD;  Location: [facility];  Service: General;  Laterality: Right;   INSERTION OF MESH Right 08/17/2013   Procedure: INSERTION OF MESH;  Surgeon: Dalia Heading, MD;  Location: [facility];  Service: General;  Laterality: Right;   MALONEY DILATION N/A 06/15/2019   Procedure: Elease Hashimoto DILATION;  Surgeon: Corbin Ade, MD;  [facility];  Service: Endoscopy;  Laterality: N/A;   ORIF TIBIA PLATEAU Right 11/07/2018   Procedure: OPEN REDUCTION INTERNAL FIXATION (ORIF) TIBIAL PLATEAU;  Surgeon: Roby Lofts, MD;  Location: [facility];  Service: Orthopedics;  Laterality: Right;   SHOULDER SURGERY Right      Mardella Layman, MD 01/10/21 1236

## 2021-01-10 NOTE — ED Triage Notes (Signed)
Sinus congestion, cough and fever the past few days

## 2024-04-06 ENCOUNTER — Encounter: Payer: Self-pay | Admitting: Gastroenterology

## 2024-04-10 NOTE — Progress Notes (Deleted)
 "  GI Office Note    Referring Provider: Jolee Elsie RAMAN, PA Primary Care Physician:  Jolee Elsie RAMAN, [place]  Primary Gastroenterologist: Lamar HERO.Rourk, MD  Chief Complaint   No chief complaint on file.  History of Present Illness   Bobby [name] is a 48 y.o. male presenting today at the request of Jolee Elsie RAMAN, [place] for evaluation of dysphagia  EGD 2021 - Abnormal esophagus suspicious for EOE s/p dilation and biopsy - Normal stomach - Normal duodenum - Pathology with reactive changes and prominent intraepithelial acidophil's up to 80 hpf with degranulation and superficial layering consistent with EOE - On day of procedure he was recommended to continue the Protonix  40 mg twice daily.  Last office visit with Therisa Stager, NP in 2021.  Noted that if he forgets several doses of his PPI he has issues with swallowing.  Usually he takes the medication twice daily and his dysphagia resolves at that time.  States a few neuropathy came up on his right side and went to primary care about this.  Has areas of firm quadrant that were sensitive to touch which he was curious about lipomas.  He stated he had had an MRI due to indeterminate renal lesions on a prior CT scan and this showed small enhancing solid round lesion in the lower pole of the right kidney concerning for small renal neoplasm.  He had seen urology and had follow-up with them scheduled.  She was unable to palpate these lesions given body habitus.  He was advised to decrease his PPI to once daily if able to tolerate and if not then return to twice daily and to call if any breakthrough.  Return in 1 year, sooner if needed.  Today:  Discussed the use of AI scribe software for clinical note transcription with the patient, who gave verbal consent to proceed.  Wt Readings from Last 6 Encounters:  12/08/19 273 lb 6.4 oz (124 kg)  07/03/19 264 lb 6.4 oz (119.9 kg)  06/10/19 257 lb 9.6 oz (116.8 kg)  06/04/19 257 lb 9.6 oz (116.8 kg)   11/14/18 242 lb 8.1 oz (110 kg)  11/06/18 250 lb (113.4 kg)    There is no height or weight on file to calculate BMI.  Current Outpatient Medications  Medication Sig Dispense Refill   celecoxib (CELEBREX) 200 MG capsule Take 200 mg by mouth daily.     clonazePAM (KLONOPIN) 2 MG tablet Take 2 mg by mouth daily as needed for anxiety.      pantoprazole  (PROTONIX ) 40 MG tablet Take 1 tablet (40 mg total) by mouth 2 (two) times daily before a meal. 60 tablet 3   predniSONE  (DELTASONE ) 20 MG tablet Take 2 tablets (40 mg total) by mouth daily. 10 tablet 0   promethazine -dextromethorphan (PROMETHAZINE -DM) 6.25-15 MG/5ML syrup Take 5 mLs by mouth 4 (four) times daily as needed for cough. 118 mL 0   No current facility-administered medications for this visit.    Past Medical History:  Diagnosis Date   Arthritis    Right hand   Eosinophilic esophagitis    GERD (gastroesophageal reflux disease)    Pulmonary embolism (HCC) 11/14/2018    Past Surgical History:  Procedure Laterality Date   BIOPSY  06/15/2019   Procedure: BIOPSY;  Surgeon: Shaaron Lamar HERO, MD;  [facility];  Service: Endoscopy;;  esophagus   CHOLECYSTECTOMY     ESOPHAGOGASTRODUODENOSCOPY (EGD) WITH ESOPHAGEAL DILATION N/A 11/13/2012   mild erosive reflux esophagitis s/p dilation.  Biopsy of gastric and duodenal bulbar erosions. Gastritis in stomach. Esophageal biopsies with possible eosinophilic esophagitis.    ESOPHAGOGASTRODUODENOSCOPY (EGD) WITH PROPOFOL  N/A 06/15/2019   Abnormal esophagus suspicious for EOE, s/p dilation and biopsy. Normal stomach, normal duodenal bulb and second portion of duodenum. Dilation with 54 F. +EOE.    HAND SURGERY     right   INGUINAL HERNIA REPAIR Right 08/17/2013   Procedure: RIGHT INGUINAL HERNIA REPAIR WITH MESH;  Surgeon: Oneil DELENA Budge, MD;  Location: [facility];  Service: General;  Laterality: Right;   INSERTION OF MESH Right 08/17/2013   Procedure: INSERTION OF MESH;  Surgeon: Oneil DELENA Budge, MD;  Location: [facility];  Service: General;  Laterality: Right;   MALONEY DILATION N/A 06/15/2019   Procedure: AGAPITO DILATION;  Surgeon: Shaaron Lamar HERO, MD;  [facility];  Service: Endoscopy;  Laterality: N/A;   ORIF TIBIA PLATEAU Right 11/07/2018   Procedure: OPEN REDUCTION INTERNAL FIXATION (ORIF) TIBIAL PLATEAU;  Surgeon: Kendal Franky SQUIBB, MD;  [facility]: [facility];  Service: Orthopedics;  Laterality: Right;   SHOULDER SURGERY Right     Family History  Problem Relation Age of Onset   Allergic rhinitis Brother    Colon cancer Neg Hx    Colon polyps Neg Hx     Allergies as of 04/13/2024 - Review Complete 01/10/2021  Allergen Reaction Noted   Amoxicillin Hives 08/03/2013   Penicillins Hives 11/06/2018    Social History   Socioeconomic History   Marital status: Married    Spouse name: Not on file   Number of children: Not on file   Years of education: Not on file   Highest education level: Not on file  Occupational History   Occupation: [employer]  Tobacco Use   Smoking status: Former    Current packs/day: 0.50    Average packs/day: 0.5 packs/day for 10.0 years (5.0 ttl pk-yrs)    Types: Cigarettes   Smokeless tobacco: Current    Types: Chew  Vaping Use   Vaping status: Never Used  Substance and Sexual Activity   Alcohol use: Not Currently    Comment: rare   Drug use: No   Sexual activity: Yes  Other Topics Concern   Not on file  Social History Narrative   ** Merged History Encounter **       Social Drivers of Health   Tobacco Use: High Risk (12/18/2023)   Received from [facility]   Patient History    Smoking Tobacco Use: Former    Smokeless Tobacco Use: Current    Passive Exposure: Past  Physicist, Medical Strain: Not on file  Food Insecurity: No Food Insecurity (08/19/2023)   Received from [facility]   Epic    Within the past 12 months, you worried that your food would run out before you got the money to buy more.: Never true    Within  the past 12 months, the food you bought just didn't last and you didn't have money to get more.: Never true  Transportation Needs: No Transportation Needs (08/19/2023)   Received from [facility]   PRAPARE - Transportation    Lack of Transportation (Medical): No    Lack of Transportation (Non-Medical): No  Physical Activity: Not on file  Stress: Not on file  Social Connections: Not on file  Intimate Partner Violence: Not on file  Depression (EYV7-0): Not on file  Alcohol Screen: Not on file  Housing: Not on file  Utilities: Low Risk (08/19/2023)  Received from [facility]   Utilities    Within the past 12 months, have you been unable to get utilities(heat, electricity) when it was really needed?: No  Health Literacy: Not on file    Review of Systems   Gen: Denies any fever, chills, fatigue, weight loss, lack of appetite.  CV: Denies chest pain, heart palpitations, peripheral edema, syncope.  Resp: Denies shortness of breath at rest or with exertion. Denies wheezing or cough.  GI: see HPI GU : Denies urinary burning, urinary frequency, urinary hesitancy MS: Denies joint pain, muscle weakness, cramps, or limitation of movement.  Derm: Denies rash, itching, dry skin Psych: Denies depression, anxiety, memory loss, and confusion Heme: Denies bruising, bleeding, and enlarged lymph nodes.  Physical Exam   There were no vitals taken for this visit.  General:   Alert and oriented. Pleasant and cooperative. Well-nourished and well-developed.  Head:  Normocephalic and atraumatic. Eyes:  Without icterus, sclera clear and conjunctiva pink.  Ears:  Normal auditory acuity. Mouth:  No deformity or lesions, oral mucosa pink.  Lungs:  Clear to auscultation bilaterally. No wheezes, rales, or rhonchi. No distress.  Heart:  S1, S2 present without murmurs appreciated.  Abdomen:  +BS, soft, non-tender and non-distended. No HSM noted. No guarding or rebound. No masses appreciated.  Rectal:   deferred *** Msk:  Symmetrical without gross deformities. Normal posture. Extremities:  Without edema. Neurologic:  Alert and  oriented x4;  grossly normal neurologically. Skin:  Intact without significant lesions or rashes. Psych:  Alert and cooperative. Normal mood and affect.  Assessment & Plan   Bobby [name] is a 48 y.o. 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  If ongoing evidence of narrowing and high concentration of the eosinophils, may need to consider budesonide oral solution or potentially Dupixent if symptoms cannot be controlled with PPI  Follow up   Follow up ***   Charmaine Melia, MSN, FNP-BC, AGACNP-BC [facility] "

## 2024-04-13 ENCOUNTER — Ambulatory Visit: Admitting: Gastroenterology

## 2024-05-11 ENCOUNTER — Telehealth: Payer: Self-pay | Admitting: *Deleted

## 2024-05-11 ENCOUNTER — Encounter: Payer: Self-pay | Admitting: Gastroenterology

## 2024-05-11 ENCOUNTER — Ambulatory Visit: Admitting: Gastroenterology

## 2024-05-11 ENCOUNTER — Telehealth: Payer: Self-pay | Admitting: Gastroenterology

## 2024-05-11 VITALS — BP 138/86 | HR 87 | Temp 98.1°F | Ht 72.0 in | Wt 279.8 lb

## 2024-05-11 DIAGNOSIS — K219 Gastro-esophageal reflux disease without esophagitis: Secondary | ICD-10-CM

## 2024-05-11 DIAGNOSIS — Z1211 Encounter for screening for malignant neoplasm of colon: Secondary | ICD-10-CM | POA: Insufficient documentation

## 2024-05-11 DIAGNOSIS — R1319 Other dysphagia: Secondary | ICD-10-CM

## 2024-05-11 DIAGNOSIS — Z8719 Personal history of other diseases of the digestive system: Secondary | ICD-10-CM

## 2024-05-11 DIAGNOSIS — K200 Eosinophilic esophagitis: Secondary | ICD-10-CM

## 2024-05-11 DIAGNOSIS — R1310 Dysphagia, unspecified: Secondary | ICD-10-CM

## 2024-05-11 MED ORDER — ESOMEPRAZOLE MAGNESIUM 40 MG PO CPDR: 40.0000 mg | DELAYED_RELEASE_CAPSULE | Freq: Every day | ORAL | 3 refills | Status: DC

## 2024-05-11 MED ORDER — PEG 3350-KCL-NA BICARB-NACL 420 G PO SOLR: 4000.0000 mL | Freq: Once | ORAL | 0 refills | Status: AC

## 2024-05-11 NOTE — Telephone Encounter (Signed)
 Please let patient know that I reviewed his work up with the allergist done in 2021. He is allergic to a component of cow milk (casein) and tree nuts. He should avoid these allergens.

## 2024-05-11 NOTE — Telephone Encounter (Signed)
 Spoke with pt. Scheduled colonoscopy/egd/dilation with Dr. Shaaron on 05/20/24. He is aware will get a pre-op phone call with his arrival time. Rx for prep to be sent to pharmacy and instructions to be sent to mychart.    PA approved via carelon Order ID: [id number]       Completed Approval Valid Through: 05/11/2024 - 07/09/2024

## 2024-05-11 NOTE — Telephone Encounter (Signed)
 Pt was made aware and verbalized understanding.

## 2024-05-11 NOTE — H&P (View-Only) (Signed)
 "    GI Office Note    Referring Provider: Jolee Elsie RAMAN, PA Primary Care Physician:  Jolee Elsie RAMAN, [place]  Primary Gastroenterologist: Ozell Hollingshead, MD   Chief Complaint   Chief Complaint  Patient presents with   Follow-up    Having issues with swallowing again.     History of Present Illness   Bobby [name] is a 49 y.o. male presenting today at the request of Elsie Jolee, [place] for dysphagia.    Discussed the use of AI scribe software for clinical note transcription with the patient, who gave verbal consent to proceed.  History of Present Illness Bobby [name] is a 49 year old male with eosinophilic esophagitis and GERD who presents for evaluation of worsening dysphagia.  Eosinophilic esophagitis was diagnosed previously with two endoscopies, most recently in 2021. Over the past year his dysphagia has progressively worsened, with a marked increase last summer. Three weeks ago he had sudden severe throat pain while eating sweet and sour chicken with rice, followed by a 5-day sensation of a gas bubble and difficulty swallowing. He denies complete obstruction, was able to swallow his saliva. Walked the floor all night due to discomfort. Since then food ongoing dysphagia. Prior allergy testing but he does not recall all of his allergies. Notes he does not avoid any certain foods. He does not recall trying fluticasone inhaler, swallowed for EOE.  He has daily heartburn and indigestion. He takes over-the-counter esomeprazole , usually two tablets each morning, and uses Tums when Nexium  is unavailable. He previously took prescription pantoprazole  but stopped when RX ran out.  He has loose stools about 3 of 5 days per week ever since his cholecystectomy. but usually one bowel movement daily. No melena, brbpr.   He underwent partial right nephrectomy for renal neoplasm and has annual CT surveillance.    No prior colonoscopy.      Prior Data   Results   EGD  2021: -  Abnormal esophagus suspicious for EOE.  Dilated and biopsied.  Consistent with EOE - Normal stomach - Normal duodenal bulb and second portion of duodenum   Medications   Current Outpatient Medications  Medication Sig Dispense Refill   esomeprazole  (NEXIUM ) 20 MG capsule Take 40 mg by mouth daily at 12 noon.     No current facility-administered medications for this visit.    Allergies   Allergies as of 05/11/2024 - Review Complete 01/10/2021  Allergen Reaction Noted   Amoxicillin Hives 08/03/2013   Penicillins Hives 11/06/2018    Past Medical History   Past Medical History:  Diagnosis Date   Arthritis    Right hand   Eosinophilic esophagitis    GERD (gastroesophageal reflux disease)    Pulmonary embolism (HCC) 11/14/2018   Renal cell carcinoma [facility])     Past Surgical History   Past Surgical History:  Procedure Laterality Date   BIOPSY  06/15/2019   Procedure: BIOPSY;  Surgeon: Hollingshead Lamar HERO, MD;  [facility];  Service: Endoscopy;;  esophagus   CHOLECYSTECTOMY     ESOPHAGOGASTRODUODENOSCOPY (EGD) WITH ESOPHAGEAL DILATION N/A 11/13/2012   mild erosive reflux esophagitis s/p dilation. Biopsy of gastric and duodenal bulbar erosions. Gastritis in stomach. Esophageal biopsies with possible eosinophilic esophagitis.    ESOPHAGOGASTRODUODENOSCOPY (EGD) WITH PROPOFOL  N/A 06/15/2019   Abnormal esophagus suspicious for EOE, s/p dilation and biopsy. Normal stomach, normal duodenal bulb and second portion of duodenum. Dilation with 54 F. +EOE.    HAND SURGERY  right   INGUINAL HERNIA REPAIR Right 08/17/2013   Procedure: RIGHT INGUINAL HERNIA REPAIR WITH MESH;  Surgeon: Oneil DELENA Budge, MD;  Location: [facility];  Service: General;  Laterality: Right;   INSERTION OF MESH Right 08/17/2013   Procedure: INSERTION OF MESH;  Surgeon: Oneil DELENA Budge, MD;  Location: [facility];  Service: General;  Laterality: Right;   MALONEY DILATION N/A 06/15/2019   Procedure: AGAPITO  DILATION;  Surgeon: Shaaron Lamar HERO, MD;  [facility];  Service: Endoscopy;  Laterality: N/A;   ORIF TIBIA PLATEAU Right 11/07/2018   Procedure: OPEN REDUCTION INTERNAL FIXATION (ORIF) TIBIAL PLATEAU;  Surgeon: Kendal Franky SQUIBB, MD;  [facility];  Service: Orthopedics;  Laterality: Right;   PARTIAL NEPHRECTOMY Right    SHOULDER SURGERY Right     Past Family History   Family History  Problem Relation Age of Onset   Allergic rhinitis Brother    Colon cancer Neg Hx    Colon polyps Neg Hx     Past Social History   Social History   Socioeconomic History   Marital status: Married    Spouse name: Not on file   Number of children: Not on file   Years of education: Not on file   Highest education level: Not on file  Occupational History   Occupation: [employer]  Tobacco Use   Smoking status: Former    Current packs/day: 0.50    Average packs/day: 0.5 packs/day for 10.0 years (5.0 ttl pk-yrs)    Types: Cigarettes   Smokeless tobacco: Current    Types: Chew  Vaping Use   Vaping status: Never Used  Substance and Sexual Activity   Alcohol use: Not Currently    Comment: rare   Drug use: No   Sexual activity: Yes  Other Topics Concern   Not on file  Social History Narrative   ** Merged History Encounter **       Social Drivers of Health   Tobacco Use: High Risk (05/11/2024)   Patient History    Smoking Tobacco Use: Former    Smokeless Tobacco Use: Current    Passive Exposure: Not on Actuary Strain: Not on file  Food Insecurity: No Food Insecurity (08/19/2023)   Received from [facility]   Epic    Within the past 12 months, you worried that your food would run out before you got the money to buy more.: Never true    Within the past 12 months, the food you bought just didn't last and you didn't have money to get more.: Never true  Transportation Needs: No Transportation Needs (08/19/2023)   Received from [facility]   PRAPARE -  Transportation    Lack of Transportation (Medical): No    Lack of Transportation (Non-Medical): No  Physical Activity: Not on file  Stress: Not on file  Social Connections: Not on file  Intimate Partner Violence: Not on file  Depression (EYV7-0): Not on file  Alcohol Screen: Not on file  Housing: Not on file  Utilities: Low Risk (08/19/2023)   Received from [facility]   Utilities    Within the past 12 months, have you been unable to get utilities(heat, electricity) when it was really needed?: No  Health Literacy: Not on file    Review of Systems   General: Negative for anorexia, weight loss, fever, chills, fatigue, weakness. Eyes: Negative for vision changes.  ENT: Negative for hoarseness, difficulty swallowing , nasal congestion. See hpi  CV: Negative for chest pain, angina, palpitations, dyspnea on exertion, peripheral edema.  Respiratory: Negative for dyspnea at rest, dyspnea on exertion, cough, sputum, wheezing.  GI: See history of present illness. GU:  Negative for dysuria, hematuria, urinary incontinence, urinary frequency, nocturnal urination.  MS: Negative for joint pain, low back pain.  Derm: Negative for rash or itching.  Neuro: Negative for weakness, abnormal sensation, seizure, frequent headaches, memory loss,  confusion.  Psych: Negative for anxiety, depression, suicidal ideation, hallucinations.  Endo: Negative for unusual weight change.  Heme: Negative for bruising or bleeding. Allergy: Negative for rash or hives.  Physical Exam   BP 138/86   Pulse 87   Temp 98.1 F (36.7 C) (Oral)   Ht 6' (1.829 m)   Wt 279 lb 12.8 oz (126.9 kg)   SpO2 96%   BMI 37.95 kg/m    General: Well-nourished, well-developed in no acute distress.  Head: Normocephalic, atraumatic.   Eyes: Conjunctiva pink, no icterus. Mouth: Oropharyngeal mucosa moist and pink  Neck: Supple without thyromegaly, masses, or lymphadenopathy.  Lungs: Clear to auscultation bilaterally.  Heart:  Regular rate and rhythm, no murmurs rubs or gallops.  Abdomen: Bowel sounds are normal, nontender, nondistended, no hepatosplenomegaly or masses,  no abdominal bruits or hernia, no rebound or guarding.   Rectal: not performed Extremities: No lower extremity edema. No clubbing or deformities.  Neuro: Alert and oriented x 4 , grossly normal neurologically.  Skin: Warm and dry, no rash or jaundice.   Psych: Alert and cooperative, normal mood and affect.  Labs   None available  Imaging Studies   No results found.  Assessment/Plan:    Assessment & Plan Dysphagia with history of eosinophilic esophagitis, GERD: Progressive dysphagia and recent suspected partial food impaction suggest ongoing esophageal inflammation and possible stricture. Allergy-mediated triggers remain a concern.  -EGD/ED with Dr. Shaaron. ASA 2. I have discussed the risks, alternatives, benefits with regards to but not limited to the risk of reaction to medication, bleeding, infection, perforation and the patient is agreeable to proceed. Written consent to be obtained. -softer foods for now, no raw vegetables, dry or tough meats pending esophageal dilation -review previous allergy testing, patient cannot recall his food allergies -pending EGD, consider fluticasone -reviewed his evaluation by Allergist 06/2019, testing reactive to casein (stable part of cow's milk) and tree nuts and advised to hold these foods for a month to see if helped, otherwise put back into diet. Patient did not go for his follow up due to other health issues.  Gastroesophageal reflux disease -reasonably well controlled on Nexium , uses 40mg  per day (OTC) and would like RX -reinforced anti-reflux measures   Colon cancer screening: -colonoscopy. ASA 2.  I have discussed the risks, alternatives, benefits with regards to but not limited to the risk of reaction to medication, bleeding, infection, perforation and the patient is agreeable to proceed. Written  consent to be obtained.         Sonny RAMAN. Ezzard, MHS, PA-C [facility]  "

## 2024-05-11 NOTE — Progress Notes (Signed)
 "    GI Office Note    Referring Provider: Jolee Elsie RAMAN, PA Primary Care Physician:  Jolee Elsie RAMAN, [place]  Primary Gastroenterologist: Ozell Hollingshead, MD   Chief Complaint   Chief Complaint  Patient presents with   Follow-up    Having issues with swallowing again.     History of Present Illness   Bobby [name] is a 49 y.o. male presenting today at the request of Elsie Jolee, [place] for dysphagia.    Discussed the use of AI scribe software for clinical note transcription with the patient, who gave verbal consent to proceed.  History of Present Illness Bobby [name] is a 49 year old male with eosinophilic esophagitis and GERD who presents for evaluation of worsening dysphagia.  Eosinophilic esophagitis was diagnosed previously with two endoscopies, most recently in 2021. Over the past year his dysphagia has progressively worsened, with a marked increase last summer. Three weeks ago he had sudden severe throat pain while eating sweet and sour chicken with rice, followed by a 5-day sensation of a gas bubble and difficulty swallowing. He denies complete obstruction, was able to swallow his saliva. Walked the floor all night due to discomfort. Since then food ongoing dysphagia. Prior allergy testing but he does not recall all of his allergies. Notes he does not avoid any certain foods. He does not recall trying fluticasone inhaler, swallowed for EOE.  He has daily heartburn and indigestion. He takes over-the-counter esomeprazole , usually two tablets each morning, and uses Tums when Nexium  is unavailable. He previously took prescription pantoprazole  but stopped when RX ran out.  He has loose stools about 3 of 5 days per week ever since his cholecystectomy. but usually one bowel movement daily. No melena, brbpr.   He underwent partial right nephrectomy for renal neoplasm and has annual CT surveillance.    No prior colonoscopy.      Prior Data   Results   EGD  2021: -  Abnormal esophagus suspicious for EOE.  Dilated and biopsied.  Consistent with EOE - Normal stomach - Normal duodenal bulb and second portion of duodenum   Medications   Current Outpatient Medications  Medication Sig Dispense Refill   esomeprazole  (NEXIUM ) 20 MG capsule Take 40 mg by mouth daily at 12 noon.     No current facility-administered medications for this visit.    Allergies   Allergies as of 05/11/2024 - Review Complete 01/10/2021  Allergen Reaction Noted   Amoxicillin Hives 08/03/2013   Penicillins Hives 11/06/2018    Past Medical History   Past Medical History:  Diagnosis Date   Arthritis    Right hand   Eosinophilic esophagitis    GERD (gastroesophageal reflux disease)    Pulmonary embolism (HCC) 11/14/2018   Renal cell carcinoma [facility])     Past Surgical History   Past Surgical History:  Procedure Laterality Date   BIOPSY  06/15/2019   Procedure: BIOPSY;  Surgeon: Hollingshead Lamar HERO, MD;  Location: [facility];  Service: Endoscopy;;  esophagus   CHOLECYSTECTOMY     ESOPHAGOGASTRODUODENOSCOPY (EGD) WITH ESOPHAGEAL DILATION N/A 11/13/2012   mild erosive reflux esophagitis s/p dilation. Biopsy of gastric and duodenal bulbar erosions. Gastritis in stomach. Esophageal biopsies with possible eosinophilic esophagitis.    ESOPHAGOGASTRODUODENOSCOPY (EGD) WITH PROPOFOL  N/A 06/15/2019   Abnormal esophagus suspicious for EOE, s/p dilation and biopsy. Normal stomach, normal duodenal bulb and second portion of duodenum. Dilation with 54 F. +EOE.    HAND SURGERY  right   INGUINAL HERNIA REPAIR Right 08/17/2013   Procedure: RIGHT INGUINAL HERNIA REPAIR WITH MESH;  Surgeon: Oneil DELENA Budge, MD;  Location: [facility];  Service: General;  Laterality: Right;   INSERTION OF MESH Right 08/17/2013   Procedure: INSERTION OF MESH;  Surgeon: Oneil DELENA Budge, MD;  Location: [facility];  Service: General;  Laterality: Right;   MALONEY DILATION N/A 06/15/2019   Procedure: AGAPITO  DILATION;  Surgeon: Shaaron Lamar HERO, MD;  Location: [facility];  Service: Endoscopy;  Laterality: N/A;   ORIF TIBIA PLATEAU Right 11/07/2018   Procedure: OPEN REDUCTION INTERNAL FIXATION (ORIF) TIBIAL PLATEAU;  Surgeon: Kendal Franky SQUIBB, MD;  Location: [facility] OR;  Service: Orthopedics;  Laterality: Right;   PARTIAL NEPHRECTOMY Right    SHOULDER SURGERY Right     Past Family History   Family History  Problem Relation Age of Onset   Allergic rhinitis Brother    Colon cancer Neg Hx    Colon polyps Neg Hx     Past Social History   Social History   Socioeconomic History   Marital status: Married    Spouse name: Not on file   Number of children: Not on file   Years of education: Not on file   Highest education level: Not on file  Occupational History   Occupation: [employer]  Tobacco Use   Smoking status: Former    Current packs/day: 0.50    Average packs/day: 0.5 packs/day for 10.0 years (5.0 ttl pk-yrs)    Types: Cigarettes   Smokeless tobacco: Current    Types: Chew  Vaping Use   Vaping status: Never Used  Substance and Sexual Activity   Alcohol use: Not Currently    Comment: rare   Drug use: No   Sexual activity: Yes  Other Topics Concern   Not on file  Social History Narrative   ** Merged History Encounter **       Social Drivers of Health   Tobacco Use: High Risk (05/11/2024)   Patient History    Smoking Tobacco Use: Former    Smokeless Tobacco Use: Current    Passive Exposure: Not on Actuary Strain: Not on file  Food Insecurity: No Food Insecurity (08/19/2023)   Received from [facility]   Epic    Within the past 12 months, you worried that your food would run out before you got the money to buy more.: Never true    Within the past 12 months, the food you bought just didn't last and you didn't have money to get more.: Never true  Transportation Needs: No Transportation Needs (08/19/2023)   Received from [facility]   PRAPARE -  Transportation    Lack of Transportation (Medical): No    Lack of Transportation (Non-Medical): No  Physical Activity: Not on file  Stress: Not on file  Social Connections: Not on file  Intimate Partner Violence: Not on file  Depression (EYV7-0): Not on file  Alcohol Screen: Not on file  Housing: Not on file  Utilities: Low Risk (08/19/2023)   Received from [facility]   Utilities    Within the past 12 months, have you been unable to get utilities(heat, electricity) when it was really needed?: No  Health Literacy: Not on file    Review of Systems   General: Negative for anorexia, weight loss, fever, chills, fatigue, weakness. Eyes: Negative for vision changes.  ENT: Negative for hoarseness, difficulty swallowing , nasal congestion. See hpi  CV: Negative for chest pain, angina, palpitations, dyspnea on exertion, peripheral edema.  Respiratory: Negative for dyspnea at rest, dyspnea on exertion, cough, sputum, wheezing.  GI: See history of present illness. GU:  Negative for dysuria, hematuria, urinary incontinence, urinary frequency, nocturnal urination.  MS: Negative for joint pain, low back pain.  Derm: Negative for rash or itching.  Neuro: Negative for weakness, abnormal sensation, seizure, frequent headaches, memory loss,  confusion.  Psych: Negative for anxiety, depression, suicidal ideation, hallucinations.  Endo: Negative for unusual weight change.  Heme: Negative for bruising or bleeding. Allergy: Negative for rash or hives.  Physical Exam   BP 138/86   Pulse 87   Temp 98.1 F (36.7 C) (Oral)   Ht 6' (1.829 m)   Wt 279 lb 12.8 oz (126.9 kg)   SpO2 96%   BMI 37.95 kg/m    General: Well-nourished, well-developed in no acute distress.  Head: Normocephalic, atraumatic.   Eyes: Conjunctiva pink, no icterus. Mouth: Oropharyngeal mucosa moist and pink  Neck: Supple without thyromegaly, masses, or lymphadenopathy.  Lungs: Clear to auscultation bilaterally.  Heart:  Regular rate and rhythm, no murmurs rubs or gallops.  Abdomen: Bowel sounds are normal, nontender, nondistended, no hepatosplenomegaly or masses,  no abdominal bruits or hernia, no rebound or guarding.   Rectal: not performed Extremities: No lower extremity edema. No clubbing or deformities.  Neuro: Alert and oriented x 4 , grossly normal neurologically.  Skin: Warm and dry, no rash or jaundice.   Psych: Alert and cooperative, normal mood and affect.  Labs   None available  Imaging Studies   No results found.  Assessment/Plan:    Assessment & Plan Dysphagia with history of eosinophilic esophagitis, GERD: Progressive dysphagia and recent suspected partial food impaction suggest ongoing esophageal inflammation and possible stricture. Allergy-mediated triggers remain a concern.  -EGD/ED with Dr. Shaaron. ASA 2. I have discussed the risks, alternatives, benefits with regards to but not limited to the risk of reaction to medication, bleeding, infection, perforation and the patient is agreeable to proceed. Written consent to be obtained. -softer foods for now, no raw vegetables, dry or tough meats pending esophageal dilation -review previous allergy testing, patient cannot recall his food allergies -pending EGD, consider fluticasone -reviewed his evaluation by Allergist 06/2019, testing reactive to casein (stable part of cow's milk) and tree nuts and advised to hold these foods for a month to see if helped, otherwise put back into diet. Patient did not go for his follow up due to other health issues.  Gastroesophageal reflux disease -reasonably well controlled on Nexium , uses 40mg  per day (OTC) and would like RX -reinforced anti-reflux measures   Colon cancer screening: -colonoscopy. ASA 2.  I have discussed the risks, alternatives, benefits with regards to but not limited to the risk of reaction to medication, bleeding, infection, perforation and the patient is agreeable to proceed. Written  consent to be obtained.         Sonny RAMAN. Ezzard, MHS, PA-C [facility]  "

## 2024-05-11 NOTE — Patient Instructions (Signed)
 Nexium  40mg  take 30 minutes before breakfast. RX sent to [facility].  Upper endoscopy and colonoscopy to be scheduled.  Until your procedure, avoid raw vegetables, dry/touch meats, hard foods.

## 2024-05-13 ENCOUNTER — Ambulatory Visit: Admitting: Gastroenterology

## 2024-05-14 ENCOUNTER — Encounter (HOSPITAL_COMMUNITY): Payer: Self-pay

## 2024-05-14 ENCOUNTER — Encounter: Payer: Self-pay | Admitting: *Deleted

## 2024-05-14 ENCOUNTER — Other Ambulatory Visit: Payer: Self-pay

## 2024-05-14 NOTE — Pre-Procedure Instructions (Signed)
 Pre-op phone call done. Patient states he has not received any prep instructions. I messaged office to have them send his instructions to his Mychart.

## 2024-05-15 ENCOUNTER — Encounter (HOSPITAL_COMMUNITY)
Admission: RE | Admit: 2024-05-15 | Discharge: 2024-05-15 | Disposition: A | Source: Ambulatory Visit | Attending: Internal Medicine

## 2024-05-20 ENCOUNTER — Encounter (HOSPITAL_COMMUNITY): Payer: Self-pay | Admitting: Internal Medicine

## 2024-05-20 ENCOUNTER — Ambulatory Visit (HOSPITAL_COMMUNITY)
Admission: RE | Admit: 2024-05-20 | Discharge: 2024-05-20 | Disposition: A | Discharge status: Home / Self Care | Attending: Internal Medicine | Admitting: Internal Medicine

## 2024-05-20 ENCOUNTER — Ambulatory Visit (HOSPITAL_COMMUNITY): Admitting: Anesthesiology

## 2024-05-20 ENCOUNTER — Encounter (HOSPITAL_COMMUNITY)
Admission: RE | Disposition: A | Discharge status: Home / Self Care | Payer: Self-pay | Source: Home / Self Care | Attending: Internal Medicine

## 2024-05-20 ENCOUNTER — Telehealth: Payer: Self-pay

## 2024-05-20 DIAGNOSIS — R1310 Dysphagia, unspecified: Secondary | ICD-10-CM | POA: Diagnosis not present

## 2024-05-20 DIAGNOSIS — F17220 Nicotine dependence, chewing tobacco, uncomplicated: Secondary | ICD-10-CM | POA: Insufficient documentation

## 2024-05-20 DIAGNOSIS — Z905 Acquired absence of kidney: Secondary | ICD-10-CM | POA: Insufficient documentation

## 2024-05-20 DIAGNOSIS — Z85528 Personal history of other malignant neoplasm of kidney: Secondary | ICD-10-CM | POA: Diagnosis not present

## 2024-05-20 DIAGNOSIS — Z1211 Encounter for screening for malignant neoplasm of colon: Secondary | ICD-10-CM | POA: Insufficient documentation

## 2024-05-20 DIAGNOSIS — K5730 Diverticulosis of large intestine without perforation or abscess without bleeding: Secondary | ICD-10-CM | POA: Diagnosis not present

## 2024-05-20 DIAGNOSIS — K200 Eosinophilic esophagitis: Secondary | ICD-10-CM | POA: Diagnosis not present

## 2024-05-20 DIAGNOSIS — K2289 Other specified disease of esophagus: Secondary | ICD-10-CM

## 2024-05-20 MED ORDER — PROPOFOL 500 MG/50ML IV EMUL
INTRAVENOUS | Status: DC | PRN
  Administered 2024-05-20: 200 ug/kg/min via INTRAVENOUS

## 2024-05-20 MED ORDER — LACTATED RINGERS IV SOLN: INTRAVENOUS | Status: DC

## 2024-05-20 MED ORDER — PROPOFOL 10 MG/ML IV BOLUS
INTRAVENOUS | Status: DC | PRN
  Administered 2024-05-20: 100 mg via INTRAVENOUS

## 2024-05-20 MED ORDER — ESOMEPRAZOLE MAGNESIUM 40 MG PO CPDR: 40.0000 mg | DELAYED_RELEASE_CAPSULE | Freq: Two times a day (BID) | ORAL | 11 refills | Status: AC

## 2024-05-20 MED ORDER — LIDOCAINE 2% (20 MG/ML) 5 ML SYRINGE
INTRAMUSCULAR | Status: DC | PRN
  Administered 2024-05-20: 100 mg via INTRAVENOUS

## 2024-05-20 NOTE — Transfer of Care (Signed)
 Immediate Anesthesia Transfer of Care Note  Patient: Bobby [name]  Procedure(s) Performed: COLONOSCOPY EGD (ESOPHAGOGASTRODUODENOSCOPY) DILATION, ESOPHAGUS  Patient Location: Short Stay  Anesthesia Type:General  Level of Consciousness: awake, alert , oriented, and patient cooperative  Airway & Oxygen Therapy: Patient Spontanous Breathing  Post-op Assessment: Report given to RN, Post -op Vital signs reviewed and stable, and Patient moving all extremities X 4  Post vital signs: Reviewed and stable  Last Vitals:  Vitals Value Taken Time  BP    Temp    Pulse    Resp    SpO2      Last Pain:  Vitals:   05/20/24 1127  PainSc: 0-No pain         Complications: No notable events documented.

## 2024-05-20 NOTE — Interval H&P Note (Signed)
 History and Physical Interval Note:  05/20/2024 11:15 AM  Bobby [name]  has presented today for surgery, with the diagnosis of SCREENING COLONOSCOPY, EOE, DYSPHAGIA.  The various methods of treatment have been discussed with the patient and family. After consideration of risks, benefits and other options for treatment, the patient has consented to  Procedures with comments: COLONOSCOPY (N/A) - 10:45am, asa 2 EGD (ESOPHAGOGASTRODUODENOSCOPY) (N/A) DILATION, ESOPHAGUS (N/A) as a surgical intervention.  The patient's history has been reviewed, patient examined, no change in status, stable for surgery.  I have reviewed the patient's chart and labs.  Questions were answered to the patient's satisfaction.     [name]    No change.  Dysphagia and first-ever screening colonoscopy EGD with ED as feasible/appropriate today.  The risks, benefits, limitations, imponderables and alternatives regarding both EGD and colonoscopy have been reviewed with the patient. Questions have been answered. All parties agreeable.

## 2024-05-20 NOTE — Anesthesia Preprocedure Evaluation (Signed)
"                                    Anesthesia Evaluation    Airway Mallampati: II  TM Distance: >3 FB     Dental no notable dental hx. (+) Teeth Intact   Pulmonary former smoker   Pulmonary exam normal        Cardiovascular      Neuro/Psych    GI/Hepatic   Endo/Other    Renal/GU      Musculoskeletal   Abdominal  (+) + obese  Peds  Hematology   Anesthesia Other Findings   Reproductive/Obstetrics                              Anesthesia Physical Anesthesia Plan  ASA: 2  Anesthesia Plan: MAC   Post-op Pain Management:    Induction: Intravenous  PONV Risk Score and Plan:   Airway Management Planned: Nasal Cannula  Additional Equipment:   Intra-op Plan:   Post-operative Plan:   Informed Consent: I have reviewed the patients History and Physical, chart, labs and discussed the procedure including the risks, benefits and alternatives for the proposed anesthesia with the patient or authorized representative who has indicated his/her understanding and acceptance.     Dental advisory given  Plan Discussed with: Anesthesiologist and CRNA  Anesthesia Plan Comments:         Anesthesia Quick Evaluation  "

## 2024-05-20 NOTE — Interval H&P Note (Signed)
 History and Physical Interval Note:  05/20/2024 11:22 AM  Bobby [name]  has presented today for surgery, with the diagnosis of SCREENING COLONOSCOPY, EOE, DYSPHAGIA.  The various methods of treatment have been discussed with the patient and family. After consideration of risks, benefits and other options for treatment, the patient has consented to  Procedures with comments: COLONOSCOPY (N/A) - 10:45am, asa 2 EGD (ESOPHAGOGASTRODUODENOSCOPY) (N/A) DILATION, ESOPHAGUS (N/A) as a surgical intervention.  The patient's history has been reviewed, patient examined, no change in status, stable for surgery.  I have reviewed the patient's chart and labs.  Questions were answered to the patient's satisfaction.     Bobby [name]    No change.  Here for first-ever screening colonoscopy.  Also history of EOE intermittent dysphagia.  EGD with esophageal dilation, excetra per plan.The risks, benefits, limitations, imponderables and alternatives regarding both EGD and colonoscopy have been reviewed with the patient. Questions have been answered. All parties agreeable.

## 2024-05-20 NOTE — Discharge Instructions (Addendum)
 EGD Discharge instructions Please read the instructions outlined below and refer to this sheet in the next few weeks. These discharge instructions provide you with general information on caring for yourself after you leave the hospital. Your doctor may also give you specific instructions. While your treatment has been planned according to the most current medical practices available, unavoidable complications occasionally occur. If you have any problems or questions after discharge, please call your doctor. ACTIVITY You may resume your regular activity but move at a slower pace for the next 24 hours.  Take frequent rest periods for the next 24 hours.  Walking will help expel (get rid of) the air and reduce the bloated feeling in your abdomen.  No driving for 24 hours (because of the anesthesia (medicine) used during the test).  You may shower.  Do not sign any important legal documents or operate any machinery for 24 hours (because of the anesthesia used during the test).  NUTRITION Drink plenty of fluids.  You may resume your normal diet.  Begin with a light meal and progress to your normal diet.  Avoid alcoholic beverages for 24 hours or as instructed by your caregiver.  MEDICATIONS You may resume your normal medications unless your caregiver tells you otherwise.  WHAT YOU CAN EXPECT TODAY You may experience abdominal discomfort such as a feeling of fullness or gas pains.  FOLLOW-UP Your doctor will discuss the results of your test with you.  SEEK IMMEDIATE MEDICAL ATTENTION IF ANY OF THE FOLLOWING OCCUR: Excessive nausea (feeling sick to your stomach) and/or vomiting.  Severe abdominal pain and distention (swelling).  Trouble swallowing.  Temperature over 101 F (37.8 C).  Rectal bleeding or vomiting of blood.    Colonoscopy Discharge Instructions  Read the instructions outlined below and refer to this sheet in the next few weeks. These discharge instructions provide you with  general information on caring for yourself after you leave the hospital. Your doctor may also give you specific instructions. While your treatment has been planned according to the most current medical practices available, unavoidable complications occasionally occur. If you have any problems or questions after discharge, call Dr. Shaaron at [phone]. ACTIVITY You may resume your regular activity, but move at a slower pace for the next 24 hours.  Take frequent rest periods for the next 24 hours.  Walking will help get rid of the air and reduce the bloated feeling in your belly (abdomen).  No driving for 24 hours (because of the medicine (anesthesia) used during the test).   Do not sign any important legal documents or operate any machinery for 24 hours (because of the anesthesia used during the test).  NUTRITION Drink plenty of fluids.  You may resume your normal diet as instructed by your doctor.  Begin with a light meal and progress to your normal diet. Heavy or fried foods are harder to digest and may make you feel sick to your stomach (nauseated).  Avoid alcoholic beverages for 24 hours or as instructed.  MEDICATIONS You may resume your normal medications unless your doctor tells you otherwise.  WHAT YOU CAN EXPECT TODAY Some feelings of bloating in the abdomen.  Passage of more gas than usual.  Spotting of blood in your stool or on the toilet paper.  IF YOU HAD POLYPS REMOVED DURING THE COLONOSCOPY: No aspirin  products for 7 days or as instructed.  No alcohol for 7 days or as instructed.  Eat a soft diet for the next 24 hours.  FINDING  OUT THE RESULTS OF YOUR TEST Not all test results are available during your visit. If your test results are not back during the visit, make an appointment with your caregiver to find out the results. Do not assume everything is normal if you have not heard from your caregiver or the medical facility. It is important for you to follow up on all of your test  results.  SEEK IMMEDIATE MEDICAL ATTENTION IF: You have more than a spotting of blood in your stool.  Your belly is swollen (abdominal distention).  You are nauseated or vomiting.  You have a temperature over 101.  You have abdominal pain or discomfort that is severe or gets worse throughout the day.    your esophagus was dilated today.  Biopsies were taken.  You have diverticulosis throughout your colon; no polyps. information on diverticulosis provided   I recommend you increase your Nexium  to 40 mg twice daily.  Take 30 minutes before breakfast and supper.  New prescription provided to your pharmacy.  Set your smart phone to remind you to take the Nexium  twice daily  Further recommendations to follow pending review of pathology report  Office visit with Sonny Kerns in 3 months.  At patient request I called Drewey  Kaplan at [phone] findings and recommendations  Recommend repeat colonoscopy in 10 years for screening purposes

## 2024-05-20 NOTE — Telephone Encounter (Signed)
 Rx sent to pharmacy on file

## 2024-05-20 NOTE — Anesthesia Postprocedure Evaluation (Signed)
"   Anesthesia Post Note  Patient: Bobby [name]  Procedure(s) Performed: COLONOSCOPY EGD (ESOPHAGOGASTRODUODENOSCOPY) DILATION, ESOPHAGUS  Patient location during evaluation: Phase II Anesthesia Type: MAC Level of consciousness: awake Pain management: pain level controlled Vital Signs Assessment: post-procedure vital signs reviewed and stable Respiratory status: spontaneous breathing and respiratory function stable Cardiovascular status: blood pressure returned to baseline and stable Postop Assessment: no headache and no apparent nausea or vomiting Anesthetic complications: no Comments: Late entry   No notable events documented.   Last Vitals:  Vitals:   05/20/24 1205 05/20/24 1210  BP: (!) 118/44   Pulse: 88   Resp:    Temp: 37 C   SpO2: (!) 89% 96%    Last Pain:  Vitals:   05/20/24 1205  TempSrc: Oral  PainSc: 0-No pain                 Yvonna PARAS Sheniah Supak      "

## 2024-05-20 NOTE — Telephone Encounter (Signed)
-----   Message from Lamar Hollingshead, MD sent at 05/20/2024 12:07 PM EST -----  new prescription.  Nexium  40 mg pill.  Dispense 60.  Take 1 twice a day 30 minutes before meals 11 refills.  Stop prescription for once daily.  Office visit with Sonny Kerns in 3 months

## 2024-05-21 LAB — SURGICAL PATHOLOGY

## 2024-05-21 NOTE — Op Note (Signed)
 [facility] Patient Name: Bobby [name] Procedure Date: 05/20/2024 11:03 AM MRN: [id number] Date of Birth: 1975-11-13 Attending MD: Lamar Ozell Hollingshead , MD, [id number] CSN: [id number] Age: 49 Admit Type: Outpatient Procedure:                Colonoscopy Indications:              Screening for colorectal malignant neoplasm Providers:                Lamar Ozell Hollingshead, MD, Jon LABOR. Gerome RN, RN,                            Daphne Mulch Technician, Technician Referring MD:             Lamar Ozell Hollingshead, MD Medicines:                Propofol  per Anesthesia Complications:            No immediate complications. Estimated Blood Loss:     Estimated blood loss: none. Procedure:                Pre-Anesthesia Assessment:                           - Prior to the procedure, a History and Physical                            was performed, and patient medications and                            allergies were reviewed. The patient's tolerance of                            previous anesthesia was also reviewed. The risks                            and benefits of the procedure and the sedation                            options and risks were discussed with the patient.                            All questions were answered, and informed consent                            was obtained. Prior Anticoagulants: The patient has                            taken no anticoagulant or antiplatelet agents. ASA                            Grade Assessment: III - A patient with severe                            systemic disease. After reviewing the risks and  benefits, the patient was deemed in satisfactory                            condition to undergo the procedure.                           After obtaining informed consent, the colonoscope                            was passed under direct vision. Throughout the                            procedure, the patient's blood  pressure, pulse, and                            oxygen saturations were monitored continuously. The                            CF-[id number] ([id number]) Colon was introduced through                            the anus and advanced to the the cecum, identified                            by appendiceal orifice and ileocecal valve. The                            colonoscopy was performed without difficulty. The                            patient tolerated the procedure well. The quality                            of the bowel preparation was adequate. Scope In: 11:44:14 AM Scope Out: 11:56:16 AM Scope Withdrawal Time: 0 hours 8 minutes 12 seconds  Total Procedure Duration: 0 hours 12 minutes 2 seconds  Findings:      The perianal and digital rectal examinations were normal.      Scattered medium-mouthed diverticula were found in the entire colon.      The exam was otherwise without abnormality on direct and retroflexion       views. Impression:               - Diverticulosis in the entire examined colon.                           - The examination was otherwise normal on direct                            and retroflexion views.                           - No specimens collected. Moderate Sedation:      Moderate (conscious) sedation was personally administered by an       anesthesia professional. The following parameters were monitored: oxygen  saturation, heart rate, blood pressure, respiratory rate, EKG, adequacy       of pulmonary ventilation, and response to care. Recommendation:           - Patient has a contact number available for                            emergencies. The signs and symptoms of potential                            delayed complications were discussed with the                            patient. Return to normal activities tomorrow.                            Written discharge instructions were provided to the                            patient.                            - Advance diet as tolerated.                           - Continue present medications.                           - Repeat colonoscopy in 10 years for screening                            purposes.                           - Return to GI office (date not yet determined).                            See EGD report. Procedure Code(s):        --- Professional ---                           [phone], Colonoscopy, flexible; diagnostic, including                            collection of specimen(s) by brushing or washing,                            when performed (separate procedure) Diagnosis Code(s):        --- Professional ---                           Z12.11, Encounter for screening for malignant                            neoplasm of colon  K57.30, Diverticulosis of large intestine without                            perforation or abscess without bleeding CPT copyright 2022 [employer]. All rights reserved. The codes documented in this report are preliminary and upon coder review may  be revised to meet current compliance requirements. Lamar HERO. Ella Golomb, MD Lamar Ozell Hollingshead, MD 05/21/2024 4:43:27 PM This report has been signed electronically. Number of Addenda: 0

## 2024-05-22 ENCOUNTER — Ambulatory Visit: Payer: Self-pay | Admitting: Internal Medicine

## 2024-05-23 ENCOUNTER — Encounter (HOSPITAL_COMMUNITY): Payer: Self-pay | Admitting: Internal Medicine
